# Patient Record
Sex: Male | Born: 1995 | Race: Asian
Health system: Southern US, Academic
[De-identification: ages and names within clinical notes are randomized; demographics above are authoritative.]

## PROBLEM LIST (undated history)

## (undated) ENCOUNTER — Encounter

## (undated) DIAGNOSIS — T7840XA Allergy, unspecified, initial encounter: Secondary | ICD-10-CM

## (undated) DIAGNOSIS — J359 Chronic disease of tonsils and adenoids, unspecified: Secondary | ICD-10-CM

## (undated) DIAGNOSIS — G43909 Migraine, unspecified, not intractable, without status migrainosus: Secondary | ICD-10-CM

## (undated) DIAGNOSIS — R55 Syncope and collapse: Secondary | ICD-10-CM

## (undated) HISTORY — PX: TONSILLECTOMY: SHX5217

## (undated) HISTORY — PX: DENTAL SURGERY: SHX609

## (undated) HISTORY — DX: Syncope and collapse: R55

## (undated) HISTORY — DX: Chronic disease of tonsils and adenoids, unspecified: J35.9

## (undated) HISTORY — DX: Migraine, unspecified, not intractable, without status migrainosus: G43.909

## (undated) HISTORY — DX: Allergy, unspecified, initial encounter: T78.40XA

---
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
## 2013-06-23 ENCOUNTER — Encounter: Payer: Self-pay | Admitting: Family Medicine

## 2013-06-23 ENCOUNTER — Ambulatory Visit (INDEPENDENT_AMBULATORY_CARE_PROVIDER_SITE_OTHER): Payer: 59 | Admitting: Family Medicine

## 2013-06-23 VITALS — BP 128/78 | HR 91 | Temp 97.9°F | Wt 230.0 lb

## 2013-06-23 DIAGNOSIS — J019 Acute sinusitis, unspecified: Secondary | ICD-10-CM

## 2013-06-23 MED ORDER — AZITHROMYCIN 250 MG PO TABS: ORAL_TABLET | ORAL | Status: AC

## 2013-06-23 NOTE — Progress Notes (Signed)
Pre visit review using our clinic review tool, if applicable. No additional management support is needed unless otherwise documented below in the visit note. 

## 2013-06-23 NOTE — Patient Instructions (Signed)

## 2013-06-23 NOTE — Progress Notes (Signed)
   Subjective:    Patient ID: James Mckenzie, male    DOB: [birth year], 17 y.o.   MRN: [id number]  HPI Acute visit Patient seen with over one week history of progressive productive cough and sinus congestion. Occasional blood-tinged nasal mucus. Has some chills but has not taken temperature. Intermittent mild sore throat. Intermittent headaches. Rarely smokes cigarettes. No dyspnea. No wheezing. No sick contacts. Denies any nausea or vomiting.  Past Medical History  Diagnosis Date  . Fainting spell 10/2010  . Tonsil and adenoid disease, chronic   . Allergy   . Migraine    Past Surgical History  Procedure Laterality Date  . Tonsillectomy      reports that he has never smoked. He does not have any smokeless tobacco history on file. His alcohol and drug histories are not on file. family history includes Arthritis in his maternal grandmother; Cancer in his maternal grandmother and paternal grandfather; Diabetes in his maternal grandfather; Hyperlipidemia in his maternal grandfather, maternal grandmother, paternal grandfather, and paternal grandmother; Hypertension in his father, maternal grandfather, maternal grandmother, mother, paternal grandfather, and paternal grandmother. No Known Allergies    Review of Systems  Constitutional: Positive for chills and fatigue.  HENT: Positive for congestion and sore throat.   Respiratory: Positive for cough. Negative for shortness of breath and wheezing.   Neurological: Positive for headaches.       Objective:   Physical Exam  Constitutional: He appears well-developed and well-nourished.  HENT:  Right Ear: External ear normal.  Left Ear: External ear normal.  Nose: Nose normal.  Mouth/Throat: Oropharynx is clear and moist.  Neck: Neck supple.  Cardiovascular: Normal rate.   Pulmonary/Chest: Effort normal and breath sounds normal. No respiratory distress. He has no wheezes. He has no rales.  Lymphadenopathy:    He has no cervical adenopathy.           Assessment & Plan:  Probable acute sinusitis. Start Zithromax for 5 days. Consider over-the-counter Mucinex. Plenty of fluids. Followup when necessary

## 2013-06-26 ENCOUNTER — Telehealth: Payer: Self-pay | Admitting: Family Medicine

## 2013-06-26 NOTE — Telephone Encounter (Signed)
Pt's mom states that pt still has cough and feels that the medication prescribed to him on 12/2 is not working. Mom would like something different to be called in. Please advise.

## 2013-06-26 NOTE — Telephone Encounter (Signed)
Called and left message on pt cell phone that Dr. Caryl Never is out of the office and will return on 06-29-13.

## 2014-09-16 ENCOUNTER — Encounter: Payer: Self-pay | Admitting: Family Medicine

## 2014-09-16 ENCOUNTER — Ambulatory Visit (INDEPENDENT_AMBULATORY_CARE_PROVIDER_SITE_OTHER): Payer: 59 | Admitting: Family Medicine

## 2014-09-16 DIAGNOSIS — R21 Rash and other nonspecific skin eruption: Secondary | ICD-10-CM

## 2014-09-16 NOTE — Progress Notes (Signed)
   Subjective:    Patient ID: James Mckenzie, male    DOB: 05-15-1996, 19 y.o.   MRN: [id number]  HPI   Patient seen with pruritic scaly rash palms of both hands for about 2 weeks. No chemical exposures. He describes several small "bumps "they came up that he popped and then left some scaling. He's not had any similar rash on his feet. No prior history of similar rash. He tried over-the-counter hydrocortisone cream without much improvement. No interdigital involvement  Past Medical History  Diagnosis Date  . Fainting spell 10/2010  . Tonsil and adenoid disease, chronic   . Allergy   . Migraine    Past Surgical History  Procedure Laterality Date  . Tonsillectomy      reports that he has never smoked. He does not have any smokeless tobacco history on file. His alcohol and drug histories are not on file. family history includes Arthritis in his maternal grandmother; Cancer in his maternal grandmother and paternal grandfather; Diabetes in his maternal grandfather; Hyperlipidemia in his maternal grandfather, maternal grandmother, paternal grandfather, and paternal grandmother; Hypertension in his father, maternal grandfather, maternal grandmother, mother, paternal grandfather, and paternal grandmother. No Known Allergies    Review of Systems  Constitutional: Negative for fever and chills.  Skin: Positive for rash.       Objective:   Physical Exam  Constitutional: He appears well-developed and well-nourished.  Cardiovascular: Normal rate and regular rhythm.   Skin: Rash noted.  Patient has some nonspecific scaling of both hands. There are no pustules. No visible vesicles. No interdigital involvement. Left hand greater than right.          Assessment & Plan:  Skin rash involving both hands. Differential is Tinea manus versus dyshidrotic type eczema. This does not appear typical of pustular psoriasis. Obtain KOH to help guide therapy.  If negative, high potency topical steroid.

## 2014-09-16 NOTE — Patient Instructions (Signed)
We will call you after KOH back to help guide topical therapy

## 2014-09-16 NOTE — Progress Notes (Signed)
Pre visit review using our clinic review tool, if applicable. No additional management support is needed unless otherwise documented below in the visit note. 

## 2014-09-17 ENCOUNTER — Other Ambulatory Visit: Payer: Self-pay

## 2014-09-17 LAB — KOH PREP: RESULT - KOH: NONE SEEN

## 2014-09-17 MED ORDER — DESOXIMETASONE 0.25 % EX CREA: 1.0000 "application " | TOPICAL_CREAM | Freq: Two times a day (BID) | CUTANEOUS | Status: DC

## 2015-02-01 ENCOUNTER — Encounter: Payer: Self-pay | Admitting: Internal Medicine

## 2015-02-01 ENCOUNTER — Ambulatory Visit (INDEPENDENT_AMBULATORY_CARE_PROVIDER_SITE_OTHER): Payer: 59 | Admitting: Internal Medicine

## 2015-02-01 VITALS — BP 130/90 | HR 80 | Temp 98.2°F | Resp 20 | Ht 70.25 in | Wt 240.0 lb

## 2015-02-01 DIAGNOSIS — J069 Acute upper respiratory infection, unspecified: Secondary | ICD-10-CM | POA: Diagnosis not present

## 2015-02-01 DIAGNOSIS — G43109 Migraine with aura, not intractable, without status migrainosus: Secondary | ICD-10-CM

## 2015-02-01 DIAGNOSIS — B9789 Other viral agents as the cause of diseases classified elsewhere: Secondary | ICD-10-CM

## 2015-02-01 MED ORDER — PREDNISONE 20 MG PO TABS: 20.0000 mg | ORAL_TABLET | Freq: Two times a day (BID) | ORAL | Status: DC

## 2015-02-01 NOTE — Progress Notes (Signed)
Pre visit review using our clinic review tool, if applicable. No additional management support is needed unless otherwise documented below in the visit note. 

## 2015-02-01 NOTE — Progress Notes (Signed)
Subjective:    Patient ID: James Mckenzie, male    DOB: May 23, 1996, 19 y.o.   MRN: [id number]  HPI  19 year old patient who presents with a chief complaint of chronic cough.  He states that he had the "flu" 6 weeks ago and has had a persistent cough.  He states that he is expectorating yellow sputum.  He also describes some nasal congestion.  He has taken no OTC medications.  No fever.  Denies any wheezing or shortness of breath. He also states that for the past month he has had worsening and more frequent migraine headaches.  He does have a history of allergies and in the past has been treated with immunotherapy  Past Medical History  Diagnosis Date  . Fainting spell 10/2010  . Tonsil and adenoid disease, chronic   . Allergy   . Migraine     History   Social History  . Marital Status: Single    Spouse Name: N/A  . Number of Children: N/A  . Years of Education: N/A   Occupational History  . Not on file.   Social History Main Topics  . Smoking status: Never Smoker   . Smokeless tobacco: Not on file  . Alcohol Use: Not on file  . Drug Use: Not on file  . Sexual Activity: Not on file   Other Topics Concern  . Not on file   Social History Narrative    Past Surgical History  Procedure Laterality Date  . Tonsillectomy      Family History  Problem Relation Age of Onset  . Hypertension Mother   . Hypertension Father   . Arthritis Maternal Grandmother   . Cancer Maternal Grandmother     breast  . Hyperlipidemia Maternal Grandmother   . Hypertension Maternal Grandmother   . Hyperlipidemia Maternal Grandfather   . Hypertension Maternal Grandfather   . Diabetes Maternal Grandfather   . Hyperlipidemia Paternal Grandmother   . Hypertension Paternal Grandmother   . Cancer Paternal Grandfather     prostate  . Hyperlipidemia Paternal Grandfather   . Hypertension Paternal Grandfather     No Known Allergies  Current Outpatient Prescriptions on File Prior to Visit    Medication Sig Dispense Refill  . desoximetasone (TOPICORT) 0.25 % cream Apply 1 application topically 2 (two) times daily. 15 g 2   No current facility-administered medications on file prior to visit.    BP 130/90 mmHg  Pulse 80  Temp(Src) 98.2 F (36.8 C) (Oral)  Resp 20  Ht 5' 10.25" (1.784 m)  Wt 240 lb (108.863 kg)  BMI 34.21 kg/m2  SpO2 98%     Review of Systems  Constitutional: Positive for activity change and fatigue. Negative for fever, chills and appetite change.  HENT: Negative for congestion, dental problem, ear pain, hearing loss, sore throat, tinnitus, trouble swallowing and voice change.   Eyes: Negative for pain, discharge and visual disturbance.  Respiratory: Positive for cough. Negative for chest tightness, wheezing and stridor.   Cardiovascular: Negative for chest pain, palpitations and leg swelling.  Gastrointestinal: Negative for nausea, vomiting, abdominal pain, diarrhea, constipation, blood in stool and abdominal distention.  Genitourinary: Negative for urgency, hematuria, flank pain, discharge, difficulty urinating and genital sores.  Musculoskeletal: Negative for myalgias, back pain, joint swelling, arthralgias, gait problem and neck stiffness.  Skin: Negative for rash.  Neurological: Positive for headaches. Negative for dizziness, syncope, speech difficulty, weakness and numbness.  Hematological: Negative for adenopathy. Does not bruise/bleed easily.  Psychiatric/Behavioral: Negative for  behavioral problems and dysphoric mood. The patient is not nervous/anxious.        Objective:   Physical Exam  Constitutional: He is oriented to person, place, and time. He appears well-developed.  Afebrile No distress  HENT:  Head: Normocephalic.  Right Ear: External ear normal.  Left Ear: External ear normal.  Eyes: Conjunctivae and EOM are normal.  Neck: Normal range of motion.  Cardiovascular: Normal rate and normal heart sounds.   Pulmonary/Chest: Breath  sounds normal. No respiratory distress. He has no wheezes. He has no rales.  Abdominal: Bowel sounds are normal.  Musculoskeletal: Normal range of motion. He exhibits no edema or tenderness.  Neurological: He is alert and oriented to person, place, and time.  Psychiatric: He has a normal mood and affect. His behavior is normal.          Assessment & Plan:   Status post URI with protracted cough History of allergic rhinitis  Will treat symptomatically.  Will also treat with oral prednisone for 6 days Will call if unimproved

## 2015-02-01 NOTE — Patient Instructions (Signed)
Acute bronchitis symptoms  are generally not helped by antibiotics.  Take over-the-counter expectorants and cough medications such as  Mucinex DM.  Call if there is no improvement in 5 to 7 days or if  you develop worsening cough, fever, or new symptoms, such as shortness of breath or chest pain.  HOME CARE INSTRUCTIONS  Get plenty of rest.  Drink enough fluids to keep your urine clear or pale yellow (unless you have a medical condition that requires fluid restriction). Increasing fluids may help thin your respiratory secretions (sputum) and reduce chest congestion, and it will prevent dehydration.  Take medicines only as directed by your health care provider.

## 2015-07-22 ENCOUNTER — Encounter: Payer: Self-pay | Admitting: Family Medicine

## 2015-07-22 ENCOUNTER — Ambulatory Visit (INDEPENDENT_AMBULATORY_CARE_PROVIDER_SITE_OTHER): Payer: 59 | Admitting: Family Medicine

## 2015-07-22 VITALS — BP 100/80 | HR 84 | Temp 98.0°F | Resp 14 | Ht 70.25 in | Wt 249.8 lb

## 2015-07-22 DIAGNOSIS — H04301 Unspecified dacryocystitis of right lacrimal passage: Secondary | ICD-10-CM | POA: Diagnosis not present

## 2015-07-22 DIAGNOSIS — L600 Ingrowing nail: Secondary | ICD-10-CM

## 2015-07-22 MED ORDER — CEPHALEXIN 500 MG PO CAPS: 500.0000 mg | ORAL_CAPSULE | Freq: Three times a day (TID) | ORAL | Status: DC

## 2015-07-22 NOTE — Progress Notes (Signed)
   Subjective:    Patient ID: James Mckenzie, male    DOB: Sep 07, 1995, 19 y.o.   MRN: [id number]  HPI Patient seen for the following issues two new acute issues  Right upper eyelid swelling for the past 2 days. No injury. No rash. Slightly tender. No secretions. No visual changes. He thought that he had a "stye". Has not tried any warm compresses. No alleviating or exacerbating factors. No history of similar problem in the past.  Left great toe pain. Recently had little bit of drainage. Tender along the medial border. Mild erythema. No fevers or chills. No injury. No history of previous ingrown toenail.  Past Medical History  Diagnosis Date  . Fainting spell 10/2010  . Tonsil and adenoid disease, chronic   . Allergy   . Migraine    Past Surgical History  Procedure Laterality Date  . Tonsillectomy      reports that he has never smoked. He does not have any smokeless tobacco history on file. His alcohol and drug histories are not on file. family history includes Arthritis in his maternal grandmother; Cancer in his maternal grandmother and paternal grandfather; Diabetes in his maternal grandfather; Hyperlipidemia in his maternal grandfather, maternal grandmother, paternal grandfather, and paternal grandmother; Hypertension in his father, maternal grandfather, maternal grandmother, mother, paternal grandfather, and paternal grandmother. No Known Allergies    Review of Systems  Constitutional: Negative for fever and chills.  HENT: Negative for congestion, rhinorrhea, sinus pressure and sore throat.   Eyes: Negative for redness, itching and visual disturbance.  Respiratory: Negative for cough.   Skin: Negative for rash.  Neurological: Negative for weakness and headaches.  Hematological: Negative for adenopathy.       Objective:   Physical Exam  Constitutional: He appears well-developed and well-nourished. No distress.  Eyes:  Right upper eyelid is slightly swollen diffusely  compared to the left. Minimally tender. He has some mild crusting along the margin of the right upper lid. No conjunctivitis changes. Pupils equal round reactive to light. No periocular rash  Cardiovascular: Normal rate and regular rhythm.   Pulmonary/Chest: Effort normal and breath sounds normal. No respiratory distress. He has no wheezes. He has no rales.  Skin:  Left great toe ingrown medial border with minimal granulation tissue. He has minimal purulent drainage in the corner but no fluctuance. No evidence or paronychia. Minimal tenderness.          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 May have some blepharitis as well with changes above. We recommended Johnson's baby shampoo and soft bristle toothbrush to clean margin of upper lid couple times daily. Warm compresses several times daily. Touch base of not improving over the next week #2 ingrown left great toenail. Mild cellulitis changes. Keflex 500 mg 3 times a day for 10 days and warm saltwater soaks. Schedule for nail excision in 1-2 weeks if not improving

## 2015-07-22 NOTE — Patient Instructions (Signed)
Warm salt water soaks for left foot two to three times daily Warm compresses to left eye 3-4 times daily Soft bristle toothbrush and gently clean left eyelid with Johnson's Baby Shampoo.

## 2015-07-22 NOTE — Progress Notes (Signed)
Pre visit review using our clinic review tool, if applicable. No additional management support is needed unless otherwise documented below in the visit note. 

## 2015-11-10 ENCOUNTER — Encounter: Payer: Self-pay | Admitting: Family Medicine

## 2015-11-10 ENCOUNTER — Ambulatory Visit (INDEPENDENT_AMBULATORY_CARE_PROVIDER_SITE_OTHER): Payer: 59 | Admitting: Family Medicine

## 2015-11-10 VITALS — BP 110/80 | HR 97 | Temp 98.3°F | Ht 70.25 in | Wt 252.0 lb

## 2015-11-10 DIAGNOSIS — Z23 Encounter for immunization: Secondary | ICD-10-CM

## 2015-11-10 DIAGNOSIS — H0016 Chalazion left eye, unspecified eyelid: Secondary | ICD-10-CM

## 2015-11-10 DIAGNOSIS — H0013 Chalazion right eye, unspecified eyelid: Secondary | ICD-10-CM

## 2015-11-10 DIAGNOSIS — L600 Ingrowing nail: Secondary | ICD-10-CM | POA: Diagnosis not present

## 2015-11-10 NOTE — Patient Instructions (Signed)
Chalazion A chalazion is a swelling or lump on the eyelid. It can affect the upper or lower eyelid. CAUSES This condition may be caused by:  Long-lasting (chronic) inflammation of the eyelid glands.  A blocked oil gland in the eyelid. SYMPTOMS Symptoms of this condition include:  A swelling on the eyelid. The swelling may spread to areas around the eye.  A hard lump on the eyelid. This lump may make it hard to see out of the eye. DIAGNOSIS This condition is diagnosed with an examination of the eye. TREATMENT This condition is treated by applying a warm compress to the eyelid. If the condition does not improve after two days, it may be treated with:  Surgery.  Medicine that is injected into the chalazion by a health care provider.  Medicine that is applied to the eye. HOME CARE INSTRUCTIONS  Do not touch the chalazion.  Do not try to remove the pus, such as by squeezing the chalazion or sticking it with a pin or needle.  Do not rub your eyes.  Wash your hands often. Dry your hands with a clean towel.  Keep your face, scalp, and eyebrows clean.  Avoid wearing eye makeup.  Apply a warm, moist compress to the eyelid 4-6 times a day for 10-15 minutes at a time. This will help to open any blocked glands and help to reduce redness and swelling.  Apply over-the-counter and prescription medicines only as told by your health care provider.  If the chalazion does not break open (rupture) on its own in a month, return to your health care provider.  Keep all follow-up appointments as told by your health care provider. This is important. SEEK MEDICAL CARE IF:  Your eyelid has not improved in 4 weeks.  Your eyelid is getting worse.  You have a fever.  The chalazion does not rupture on its own with home treatment in a month. SEEK IMMEDIATE MEDICAL CARE IF:  You have pain in your eye.  Your vision changes.  The chalazion becomes painful or red  The chalazion gets  bigger.   This information is not intended to replace advice given to you by your health care provider. Make sure you discuss any questions you have with your health care provider.   Document Released: 07/06/2000 Document Revised: 03/30/2015 Document Reviewed: 11/01/2014 Elsevier Interactive Patient Education 2016 [employer]  Elevate foot frequently for next several hours Keep dry tonight then clean with soap and water

## 2015-11-10 NOTE — Progress Notes (Signed)
Pre visit review using our clinic review tool, if applicable. No additional management support is needed unless otherwise documented below in the visit note. 

## 2015-11-10 NOTE — Progress Notes (Signed)
   Subjective:    Patient ID: James MilchJoshua Marcussen, male    DOB: 04-21-1996, 20 y.o.   MRN: [id number]  HPI Patient seen for the following to new acute issues:  He's noted small nonpainful nodular swelling both of the upper medial aspect of the left and right lids. No erythema. No drainage. No visual difficulties. He first noticed several weeks ago and has used warm compresses several times daily without improvement  Patient also complains of ingrown left great toenail. Marland Kitchen. He had some redness and slight drainage over the past several weeks not improved with salt water soaks. No fevers or chills.   Increasing pain with ambulation  prior history of ingrown toenail which he states is on the right- though our records indicate left  Past Medical History  Diagnosis Date  . Fainting spell 10/2010  . Tonsil and adenoid disease, chronic   . Allergy   . Migraine    Past Surgical History  Procedure Laterality Date  . Tonsillectomy      reports that he has never smoked. He does not have any smokeless tobacco history on file. His alcohol and drug histories are not on file. family history includes Arthritis in his maternal grandmother; Cancer in his maternal grandmother and paternal grandfather; Diabetes in his maternal grandfather; Hyperlipidemia in his maternal grandfather, maternal grandmother, paternal grandfather, and paternal grandmother; Hypertension in his father, maternal grandfather, maternal grandmother, mother, paternal grandfather, and paternal grandmother. No Known Allergies    Review of Systems  Constitutional: Negative for fever and chills.  Eyes: Negative for photophobia, pain, discharge, redness, itching and visual disturbance.  Musculoskeletal: Negative for gait problem.  Neurological: Negative for headaches.  Hematological: Negative for adenopathy. Does not bruise/bleed easily.       Objective:   Physical Exam  Constitutional: He appears well-developed and well-nourished.  Eyes:   Most prominent of the left upper lid medially and also right upper lid-no signs of inflammation such as erythema or tenderness. No evidence for stye.  Cardiovascular: Normal rate and regular rhythm.   Pulmonary/Chest: Effort normal and breath sounds normal. No respiratory distress. He has no wheezes. He has no rales.  Skin:  Left great toe reveals ingrown medial border. He has some very minimal granulation tissue. Tender to palpation. Minimal erythema.          Assessment & Plan:   #1 chalazions-  Right and left upper lids. No signs of infection. Not improved with conservative treatment with warm compresses. Set up ophthalmology referral per patient request   #2 ingrown left great toenail. Patient has tried conservative measures with saltwater soaks for several weeks without improvement. We discussed risk and benefits of digital block and partial nail excision and patient consented. Toe prepped with Betadine. Using 2% plain Xylocaine digital block left great toe. Using straight hemostats and surgical scissors removed approximately one third of the toenail (medial border). Minimal bleeding. Topical antibiotic and dressing applied. Wound care  Instruction given

## 2015-11-17 LAB — HM DIABETES EYE EXAM

## 2015-11-18 ENCOUNTER — Ambulatory Visit (INDEPENDENT_AMBULATORY_CARE_PROVIDER_SITE_OTHER): Payer: 59 | Admitting: Family Medicine

## 2015-11-18 ENCOUNTER — Other Ambulatory Visit: Payer: Self-pay | Admitting: Family Medicine

## 2015-11-18 VITALS — BP 110/70 | HR 86 | Temp 97.6°F | Ht 70.25 in | Wt 250.0 lb

## 2015-11-18 DIAGNOSIS — L600 Ingrowing nail: Secondary | ICD-10-CM

## 2015-11-18 NOTE — Patient Instructions (Addendum)
Ingrown Toenail An ingrown toenail occurs when the corner or sides of your toenail grow into the surrounding skin. The big toe is most commonly affected, but it can happen to any of your toes. If your ingrown toenail is not treated, you will be at risk for infection. CAUSES This condition may be caused by:  Wearing shoes that are too small or tight.  Injury or trauma, such as stubbing your toe or having your toe stepped on.  Improper cutting or care of your toenails.  Being born with (congenital) nail or foot abnormalities, such as having a nail that is too big for your toe. RISK FACTORS Risk factors for an ingrown toenail include:  Age. Your nails tend to thicken as you get older, so ingrown nails are more common in older people.  Diabetes.  Cutting your toenails incorrectly.  Blood circulation problems. SYMPTOMS Symptoms may include:  Pain, soreness, or tenderness.  Redness.  Swelling.  Hardening of the skin surrounding the toe. Your ingrown toenail may be infected if there is fluid, pus, or drainage. DIAGNOSIS  An ingrown toenail may be diagnosed by medical history and physical exam. If your toenail is infected, your health care provider may test a sample of the drainage. TREATMENT Treatment depends on the severity of your ingrown toenail. Some ingrown toenails may be treated at home. More severe or infected ingrown toenails may require surgery to remove all or part of the nail. Infected ingrown toenails may also be treated with antibiotic medicines. HOME CARE INSTRUCTIONS  If you were prescribed an antibiotic medicine, finish all of it even if you start to feel better.  Soak your foot in warm soapy water for 20 minutes, 3 times per day or as directed by your health care provider.  Carefully lift the edge of the nail away from the sore skin by wedging a small piece of cotton under the corner of the nail. This may help with the pain. Be careful not to cause more injury  to the area.  Wear shoes that fit well. If your ingrown toenail is causing you pain, try wearing sandals, if possible.  Trim your toenails regularly and carefully. Do not cut them in a curved shape. Cut your toenails straight across. This prevents injury to the skin at the corners of the toenail.  Keep your feet clean and dry.  If you are having trouble walking and are given crutches by your health care provider, use them as directed.  Do not pick at your toenail or try to remove it yourself.  Take medicines only as directed by your health care provider.  Keep all follow-up visits as directed by your health care provider. This is important. SEEK MEDICAL CARE IF:  Your symptoms do not improve with treatment. SEEK IMMEDIATE MEDICAL CARE IF:  You have red streaks that start at your foot and go up your leg.  You have a fever.  You have increased redness, swelling, or pain.  You have fluid, blood, or pus coming from your toenail.   This information is not intended to replace advice given to you by your health care provider. Make sure you discuss any questions you have with your health care provider.   Document Released: 07/06/2000 Document Revised: 11/23/2014 Document Reviewed: 06/02/2014 Elsevier Interactive Patient Education 2016 [employer]   Keep foot elevated tonight Keep dry until tomorrow then clean daily with soap and water Apply topical antibiotic to toe once daily for 5-6 days. Follow up as needed.

## 2015-11-18 NOTE — Progress Notes (Signed)
   Subjective:    Patient ID: James Mckenzie, male    DOB: 21-Feb-1996, 20 y.o.   MRN: [id number]  HPI   Recent left ingrown toenail removed and patient returns today requesting right toenail be removed along the medial border. He's had some soreness which is somewhat intermittent. Has had occasional drainage and erythema. Ambulating without much difficulty.  Had surgery yesterday for chalazion removal of right and left upper eye lids.  Past Medical History  Diagnosis Date  . Fainting spell 10/2010  . Tonsil and adenoid disease, chronic   . Allergy   . Migraine    Past Surgical History  Procedure Laterality Date  . Tonsillectomy      reports that he has never smoked. He does not have any smokeless tobacco history on file. His alcohol and drug histories are not on file. family history includes Arthritis in his maternal grandmother; Cancer in his maternal grandmother and paternal grandfather; Diabetes in his maternal grandfather; Hyperlipidemia in his maternal grandfather, maternal grandmother, paternal grandfather, and paternal grandmother; Hypertension in his father, maternal grandfather, maternal grandmother, mother, paternal grandfather, and paternal grandmother. No Known Allergies    Review of Systems  Constitutional: Negative for fever and chills.  Eyes: Negative for visual disturbance.       Objective:   Physical Exam  Constitutional: He appears well-developed and well-nourished.  Cardiovascular: Normal rate and regular rhythm.   Pulmonary/Chest: Effort normal and breath sounds normal. No respiratory distress. He has no wheezes. He has no rales.  Skin:  Right great toe-minimally tender on the medial border. No granulation tissue. No signs of secondary infection.          Assessment & Plan:   Ingrown right great toenail. Discussed risk and benefits of partial excision. Patient consented. Digital block with 2% plain Xylocaine. Toe prepped with Betadine.  After achieving  full digital block, used straight hemostats and freed up medial border of nail and using surgical scissors cut to base of nail 1/3 involved medial border.  This was removed without difficulty.  Minimal bleeding.

## 2015-11-18 NOTE — Progress Notes (Signed)
Pre visit review using our clinic review tool, if applicable. No additional management support is needed unless otherwise documented below in the visit note. 

## 2015-11-29 ENCOUNTER — Emergency Department (HOSPITAL_COMMUNITY)
Admission: EM | Admit: 2015-11-29 | Discharge: 2015-11-29 | Disposition: A | Discharge status: Home / Self Care | Payer: 59 | Attending: Emergency Medicine | Admitting: Emergency Medicine

## 2015-11-29 ENCOUNTER — Emergency Department (HOSPITAL_COMMUNITY): Payer: 59

## 2015-11-29 ENCOUNTER — Encounter (HOSPITAL_COMMUNITY): Payer: Self-pay | Admitting: Emergency Medicine

## 2015-11-29 ENCOUNTER — Encounter: Payer: Self-pay | Admitting: Adult Health

## 2015-11-29 ENCOUNTER — Ambulatory Visit (INDEPENDENT_AMBULATORY_CARE_PROVIDER_SITE_OTHER): Payer: 59 | Admitting: Adult Health

## 2015-11-29 VITALS — BP 120/70 | Temp 98.4°F | Wt 249.0 lb

## 2015-11-29 DIAGNOSIS — Z792 Long term (current) use of antibiotics: Secondary | ICD-10-CM | POA: Insufficient documentation

## 2015-11-29 DIAGNOSIS — Z791 Long term (current) use of non-steroidal anti-inflammatories (NSAID): Secondary | ICD-10-CM | POA: Insufficient documentation

## 2015-11-29 DIAGNOSIS — J0100 Acute maxillary sinusitis, unspecified: Secondary | ICD-10-CM | POA: Diagnosis not present

## 2015-11-29 DIAGNOSIS — R1084 Generalized abdominal pain: Secondary | ICD-10-CM | POA: Diagnosis not present

## 2015-11-29 DIAGNOSIS — R1031 Right lower quadrant pain: Secondary | ICD-10-CM | POA: Diagnosis present

## 2015-11-29 DIAGNOSIS — R1030 Lower abdominal pain, unspecified: Secondary | ICD-10-CM

## 2015-11-29 DIAGNOSIS — Z79899 Other long term (current) drug therapy: Secondary | ICD-10-CM | POA: Insufficient documentation

## 2015-11-29 DIAGNOSIS — J0140 Acute pansinusitis, unspecified: Secondary | ICD-10-CM

## 2015-11-29 DIAGNOSIS — R112 Nausea with vomiting, unspecified: Secondary | ICD-10-CM

## 2015-11-29 LAB — COMPREHENSIVE METABOLIC PANEL
ALBUMIN: 4.6 g/dL (ref 3.5–5.0)
ALK PHOS: 71 U/L (ref 38–126)
ALT: 30 U/L (ref 17–63)
AST: 17 U/L (ref 15–41)
Anion gap: 9 (ref 5–15)
BUN: 10 mg/dL (ref 6–20)
CHLORIDE: 99 mmol/L — AB (ref 101–111)
CO2: 27 mmol/L (ref 22–32)
Calcium: 9.4 mg/dL (ref 8.9–10.3)
Creatinine, Ser: 1.12 mg/dL (ref 0.61–1.24)
GFR calc Af Amer: >60 mL/min (ref >60)
GFR calc non Af Amer: >60 mL/min (ref >60)
Glucose, Bld: 105 mg/dL — ABNORMAL HIGH (ref 65–99)
Potassium: 3.6 mmol/L (ref 3.5–5.1)
SODIUM: 135 mmol/L (ref 135–145)
Total Bilirubin: 1 mg/dL (ref 0.3–1.2)
Total Protein: 7.8 g/dL (ref 6.5–8.1)

## 2015-11-29 LAB — CBC
HEMATOCRIT: 44.5 % (ref 39.0–52.0)
HEMOGLOBIN: 14.8 g/dL (ref 13.0–17.0)
MCH: 28.3 pg (ref 26.0–34.0)
MCHC: 33.3 g/dL (ref 30.0–36.0)
MCV: 85.1 fL (ref 78.0–100.0)
Platelets: 191 10*3/uL (ref 150–400)
RBC: 5.23 MIL/uL (ref 4.22–5.81)
RDW: 14.2 % (ref 11.5–15.5)
WBC: 10.6 10*3/uL — ABNORMAL HIGH (ref 4.0–10.5)

## 2015-11-29 LAB — URINALYSIS, ROUTINE W REFLEX MICROSCOPIC
Bilirubin Urine: NEGATIVE
GLUCOSE, UA: NEGATIVE mg/dL
HGB URINE DIPSTICK: NEGATIVE
Ketones, ur: NEGATIVE mg/dL
Leukocytes, UA: NEGATIVE
Nitrite: NEGATIVE
Protein, ur: NEGATIVE mg/dL
SPECIFIC GRAVITY, URINE: 1.031 — AB (ref 1.005–1.030)
pH: 6.5 (ref 5.0–8.0)

## 2015-11-29 LAB — LIPASE, BLOOD: Lipase: 17 U/L (ref 11–51)

## 2015-11-29 MED ORDER — HYDROCODONE-HOMATROPINE 5-1.5 MG/5ML PO SYRP: 5.0000 mL | ORAL_SOLUTION | Freq: Four times a day (QID) | ORAL | Status: DC | PRN

## 2015-11-29 MED ORDER — IOPAMIDOL (ISOVUE-300) INJECTION 61%
100.0000 mL | Freq: Once | INTRAVENOUS | Status: AC | PRN
  Administered 2015-11-29: 100 mL via INTRAVENOUS

## 2015-11-29 MED ORDER — AMOXICILLIN-POT CLAVULANATE 875-125 MG PO TABS: 1.0000 | ORAL_TABLET | Freq: Two times a day (BID) | ORAL | Status: DC

## 2015-11-29 MED ORDER — ONDANSETRON HCL 4 MG PO TABS: 4.0000 mg | ORAL_TABLET | Freq: Four times a day (QID) | ORAL | Status: DC

## 2015-11-29 MED ORDER — DIATRIZOATE MEGLUMINE & SODIUM 66-10 % PO SOLN
30.0000 mL | Freq: Once | ORAL | Status: AC
  Administered 2015-11-29: 30 mL via ORAL

## 2015-11-29 MED ORDER — ONDANSETRON HCL 4 MG/2ML IJ SOLN
4.0000 mg | Freq: Once | INTRAMUSCULAR | Status: AC
  Administered 2015-11-29: 4 mg via INTRAVENOUS
  Filled 2015-11-29: qty 2

## 2015-11-29 MED ORDER — SODIUM CHLORIDE 0.9 % IV BOLUS (SEPSIS)
1000.0000 mL | Freq: Once | INTRAVENOUS | Status: AC
  Administered 2015-11-29: 1000 mL via INTRAVENOUS

## 2015-11-29 MED ORDER — FENTANYL CITRATE (PF) 100 MCG/2ML IJ SOLN
100.0000 ug | Freq: Once | INTRAMUSCULAR | Status: AC
  Administered 2015-11-29: 100 ug via INTRAVENOUS
  Filled 2015-11-29: qty 2

## 2015-11-29 NOTE — ED Provider Notes (Signed)
Patient accepted in sign out from Dr. Radford PaxBeaton pending CT. CT negative for acute process.  As was plan CT was negative and patient was discharged home in stable condition.  Leta BaptistEmily Roe Nguyen, MD 11/29/15 [phone]

## 2015-11-29 NOTE — Patient Instructions (Signed)
It was great meeting you today!  I am going to send you to the ER so that you can be further evaluated.

## 2015-11-29 NOTE — Discharge Instructions (Signed)
Sinusitis, Adult °Sinusitis is redness, soreness, and inflammation of the paranasal sinuses. Paranasal sinuses are air pockets within the bones of your face. They are located beneath your eyes, in the middle of your forehead, and above your eyes. In healthy paranasal sinuses, mucus is able to drain out, and air is able to circulate through them by way of your nose. However, when your paranasal sinuses are inflamed, mucus and air can become trapped. This can allow bacteria and other germs to grow and cause infection. °Sinusitis can develop quickly and last only a short time (acute) or continue over a long period (chronic). Sinusitis that lasts for more than 12 weeks is considered chronic. °CAUSES °Causes of sinusitis include: °· Allergies. °· Structural abnormalities, such as displacement of the cartilage that separates your nostrils (deviated septum), which can decrease the air flow through your nose and sinuses and affect sinus drainage. °· Functional abnormalities, such as when the small hairs (cilia) that line your sinuses and help remove mucus do not work properly or are not present. °SIGNS AND SYMPTOMS °Symptoms of acute and chronic sinusitis are the same. The primary symptoms are pain and pressure around the affected sinuses. Other symptoms include: °· Upper toothache. °· Earache. °· Headache. °· Bad breath. °· Decreased sense of smell and taste. °· A cough, which worsens when you are lying flat. °· Fatigue. °· Fever. °· Thick drainage from your nose, which often is green and may contain pus (purulent). °· Swelling and warmth over the affected sinuses. °DIAGNOSIS °Your health care provider will perform a physical exam. During your exam, your health care provider may perform any of the following to help determine if you have acute sinusitis or chronic sinusitis: °· Look in your nose for signs of abnormal growths in your nostrils (nasal polyps). °· Tap over the affected sinus to check for signs of  infection. °· View the inside of your sinuses using an imaging device that has a light attached (endoscope). °If your health care provider suspects that you have chronic sinusitis, one or more of the following tests may be recommended: °· Allergy tests. °· Nasal culture. A sample of mucus is taken from your nose, sent to a lab, and screened for bacteria. °· Nasal cytology. A sample of mucus is taken from your nose and examined by your health care provider to determine if your sinusitis is related to an allergy. °TREATMENT °Most cases of acute sinusitis are related to a viral infection and will resolve on their own within 10 days. Sometimes, medicines are prescribed to help relieve symptoms of both acute and chronic sinusitis. These may include pain medicines, decongestants, nasal steroid sprays, or saline sprays. °However, for sinusitis related to a bacterial infection, your health care provider will prescribe antibiotic medicines. These are medicines that will help kill the bacteria causing the infection. °Rarely, sinusitis is caused by a fungal infection. In these cases, your health care provider will prescribe antifungal medicine. °For some cases of chronic sinusitis, surgery is needed. Generally, these are cases in which sinusitis recurs more than 3 times per year, despite other treatments. °HOME CARE INSTRUCTIONS °· Drink plenty of water. Water helps thin the mucus so your sinuses can drain more easily. °· Use a humidifier. °· Inhale steam 3-4 times a day (for example, sit in the bathroom with the shower running). °· Apply a warm, moist washcloth to your face 3-4 times a day, or as directed by your health care provider. °· Use saline nasal sprays to help   moisten and clean your sinuses.  Take medicines only as directed by your health care provider.  If you were prescribed either an antibiotic or antifungal medicine, finish it all even if you start to feel better. SEEK IMMEDIATE MEDICAL CARE IF:  You have  increasing pain or severe headaches.  You have nausea, vomiting, or drowsiness.  You have swelling around your face.  You have vision problems.  You have a stiff neck.  You have difficulty breathing.   This information is not intended to replace advice given to you by your health care provider. Make sure you discuss any questions you have with your health care provider.   Document Released: 07/09/2005 Document Revised: 07/30/2014 Document Reviewed: 07/24/2011 Elsevier Interactive Patient Education 2016 Elsevier Inc.  Nausea and Vomiting Nausea means you feel sick to your stomach. Throwing up (vomiting) is a reflex where stomach contents come out of your mouth. HOME CARE   Take medicine as told by your doctor.  Do not force yourself to eat. However, you do need to drink fluids.  If you feel like eating, eat a normal diet as told by your doctor.  Eat rice, wheat, potatoes, bread, lean meats, yogurt, fruits, and vegetables.  Avoid high-fat foods.  Drink enough fluids to keep your pee (urine) clear or pale yellow.  Ask your doctor how to replace body fluid losses (rehydrate). Signs of body fluid loss (dehydration) include:  Feeling very thirsty.  Dry lips and mouth.  Feeling dizzy.  Dark pee.  Peeing less than normal.  Feeling confused.  Fast breathing or heart rate. GET HELP RIGHT AWAY IF:   You have blood in your throw up.  You have black or bloody poop (stool).  You have a bad headache or stiff neck.  You feel confused.  You have bad belly (abdominal) pain.  You have chest pain or trouble breathing.  You do not pee at least once every 8 hours.  You have cold, clammy skin.  You keep throwing up after 24 to 48 hours.  You have a fever. MAKE SURE YOU:   Understand these instructions.  Will watch your condition.  Will get help right away if you are not doing well or get worse.   This information is not intended to replace advice given to you by  your health care provider. Make sure you discuss any questions you have with your health care provider.   Document Released: 12/26/2007 Document Revised: 10/01/2011 Document Reviewed: 12/08/2010 Elsevier Interactive Patient Education [employer] Elsevier Inc.

## 2015-11-29 NOTE — ED Notes (Signed)
Pt c/o gen abd pain with NVD x 2 days.  Pt has had a sinus infection and has been coughing up streaks of blood.  Pt sent here by doctor for CT scan to rule out appy.

## 2015-11-29 NOTE — Progress Notes (Signed)
   Subjective:    Patient ID: Daralene MilchJoshua Fujii, male    DOB: 06/17/1996, 20 y.o.   MRN: [id number]  HPI  20 year old male who presents to the office for 24 hours of sudden onset, fever, nausea, vomiting, abdominal pain, generalized muscle aches, subjective fever with chills, sinus pain and pressure and productive cough.   He has tried taking OTC cold medications but was unable to hold them down.   He continues to be nauseated.    Review of Systems  Constitutional: Positive for fever, chills, diaphoresis, activity change, appetite change and fatigue.  HENT: Positive for congestion, postnasal drip, rhinorrhea and sinus pressure. Negative for ear discharge, ear pain and sore throat.   Respiratory: Positive for cough.   Cardiovascular: Negative.   Gastrointestinal: Positive for nausea, vomiting and abdominal pain. Negative for abdominal distention.  Musculoskeletal: Positive for myalgias.  Skin: Negative.        Objective:   Physical Exam  Constitutional: He is oriented to person, place, and time. He appears well-developed and well-nourished. No distress.  HENT:  Head: Normocephalic and atraumatic.  Right Ear: Hearing, tympanic membrane, external ear and ear canal normal.  Left Ear: Hearing, tympanic membrane, external ear and ear canal normal.  Nose: Mucosal edema and rhinorrhea present. Right sinus exhibits maxillary sinus tenderness and frontal sinus tenderness. Left sinus exhibits maxillary sinus tenderness and frontal sinus tenderness.  Mouth/Throat: Oropharynx is clear and moist. No oropharyngeal exudate.  Eyes: Conjunctivae and EOM are normal. Pupils are equal, round, and reactive to light. Right eye exhibits no discharge. Left eye exhibits no discharge.  Neck: Normal range of motion. Neck supple.  Cardiovascular: Normal rate, regular rhythm, normal heart sounds and intact distal pulses.  Exam reveals no gallop and no friction rub.   No murmur heard. Pulmonary/Chest: Effort  normal and breath sounds normal. No respiratory distress. He has no wheezes. He has no rales. He exhibits no tenderness.  Abdominal: Soft. Bowel sounds are normal. He exhibits no distension and no mass. There is no splenomegaly or hepatomegaly. There is tenderness in the right upper quadrant, right lower quadrant, epigastric area, periumbilical area, left upper quadrant and left lower quadrant. There is rebound, guarding and tenderness at McBurney's point. No hernia.  Musculoskeletal: Normal range of motion.  Lymphadenopathy:    He has no cervical adenopathy.  Neurological: He is alert and oriented to person, place, and time.  Skin: Skin is warm and dry. No rash noted. He is not diaphoretic. No erythema. No pallor.  Psychiatric: He has a normal mood and affect. His behavior is normal. Judgment and thought content normal.  Vitals reviewed.      Assessment & Plan:  1. Generalized abdominal pain - Sudden onset nausea/vomiting/subjective fever and chills with appetite loss. He was tender throughout abdomen but severely tender in right lower quadrant. I cannot rule out appendicitis at this time. [facility] for further evaluation as I do not think his abdominal pain is from dry heaves.   Charge RN notified  2. Acute pansinusitis, recurrence not specified - Will treat depending on ER course.   Shirline Freesory Izaah Westman, NP

## 2015-11-29 NOTE — ED Provider Notes (Signed)
CSN: [id number]     Arrival date & time 11/29/15  1041 History   First MD Initiated Contact with Patient 11/29/15 1320     Chief Complaint  Patient presents with  . Abdominal Pain      HPI Patient comes in with history of sinus infection and coughing up green sputum but has had nausea vomiting and abdominal pain.  Was seen by his primary care doctor this morning and sent over for evaluation of possible appendicitis.  He has had decreased appetite and pain has localized right lower quadrant. Past Medical History  Diagnosis Date  . Fainting spell 10/2010  . Tonsil and adenoid disease, chronic   . Allergy   . Migraine    Past Surgical History  Procedure Laterality Date  . Tonsillectomy     Family History  Problem Relation Age of Onset  . Hypertension Mother   . Hypertension Father   . Arthritis Maternal Grandmother   . Cancer Maternal Grandmother     breast  . Hyperlipidemia Maternal Grandmother   . Hypertension Maternal Grandmother   . Hyperlipidemia Maternal Grandfather   . Hypertension Maternal Grandfather   . Diabetes Maternal Grandfather   . Hyperlipidemia Paternal Grandmother   . Hypertension Paternal Grandmother   . Cancer Paternal Grandfather     prostate  . Hyperlipidemia Paternal Grandfather   . Hypertension Paternal Grandfather    Social History  Substance Use Topics  . Smoking status: Never Smoker   . Smokeless tobacco: None  . Alcohol Use: None    Review of Systems  All other systems reviewed and are negative.     Allergies  Review of patient's allergies indicates no known allergies.  Home Medications   Prior to Admission medications   Medication Sig Start Date End Date Taking? Authorizing Provider  acetaminophen (TYLENOL) 500 MG tablet Take 1,000 mg by mouth every 6 (six) hours as needed for moderate pain.   Yes Historical Provider, MD  ibuprofen (ADVIL,MOTRIN) 200 MG tablet Take 800 mg by mouth every 6 (six) hours as needed for moderate pain.    Yes Historical Provider, MD  oxymetazoline (AFRIN) 0.05 % nasal spray Place 1 spray into both nostrils daily as needed for congestion.   Yes Historical Provider, MD  amoxicillin-clavulanate (AUGMENTIN) 875-125 MG tablet Take 1 tablet by mouth every 12 (twelve) hours. 11/29/15   Nelva Nayobert Melda Mermelstein, MD  HYDROcodone-homatropine [facility](HYCODAN) 5-1.5 MG/5ML syrup Take 5 mLs by mouth every 6 (six) hours as needed for cough. 11/29/15   Nelva Nayobert Chaylee Ehrsam, MD  neomycin-polymyxin b-dexamethasone (MAXITROL) 3.5-10000-0.1 OINT Place 1 application into both eyes daily.    Historical Provider, MD  ondansetron (ZOFRAN) 4 MG tablet Take 1 tablet (4 mg total) by mouth every 6 (six) hours. 11/29/15   Nelva Nayobert Lane Kjos, MD   BP 123/68 mmHg  Pulse 83  Temp(Src) 98.1 F (36.7 C) (Oral)  Resp 13  SpO2 99% Physical Exam  Constitutional: He is oriented to person, place, and time. He appears well-developed and well-nourished. No distress.  HENT:  Head: Normocephalic and atraumatic.  Eyes: Pupils are equal, round, and reactive to light.  Neck: Normal range of motion.  Cardiovascular: Normal rate and intact distal pulses.   Pulmonary/Chest: No respiratory distress.  Abdominal: Normal appearance. He exhibits no distension. There is tenderness in the right lower quadrant. There is no rigidity, no rebound and no guarding.  Musculoskeletal: Normal range of motion.  Neurological: He is alert and oriented to person, place, and time. No cranial nerve deficit.  Skin: Skin is warm and dry. No rash noted.  Psychiatric: He has a normal mood and affect. His behavior is normal.  Nursing note and vitals reviewed.   ED Course  Procedures (including critical care time) Labs Review Labs Reviewed  COMPREHENSIVE METABOLIC PANEL - Abnormal; Notable for the following:    Chloride 99 (*)    Glucose, Bld 105 (*)    All other components within normal limits  CBC - Abnormal; Notable for the following:    WBC 10.6 (*)    All other components within  normal limits  URINALYSIS, ROUTINE W REFLEX MICROSCOPIC (NOT AT [facility]) - Abnormal; Notable for the following:    Specific Gravity, Urine 1.031 (*)    All other components within normal limits  LIPASE, BLOOD    Imaging Review Dg Chest 2 View  11/29/2015  CLINICAL DATA:  Upper respiratory symptoms, cough and shortness of breath EXAM: CHEST  2 VIEW COMPARISON:  11/29/2015 FINDINGS: The heart size and mediastinal contours are within normal limits. Both lungs are clear. The visualized skeletal structures are unremarkable. IMPRESSION: No active cardiopulmonary disease. Electronically Signed   By: Judie Petit.  Shick M.D.   On: 11/29/2015 15:49   Ct Abdomen Pelvis W Contrast  11/29/2015  CLINICAL DATA:  Pt c/o gen abd pain with NVD x 2 days. Pt has had a sinus infection and has been coughing up streaks of blood. Pt sent here by doctor for CT scan to rule out appy. no prior abd surgery EXAM: CT ABDOMEN AND PELVIS WITH CONTRAST TECHNIQUE: Multidetector CT imaging of the abdomen and pelvis was performed using the standard protocol following bolus administration of intravenous contrast. CONTRAST:  ISOVUE-300 IOPAMIDOL (ISOVUE-300) INJECTION 61% COMPARISON:  None. FINDINGS: Lower chest:  No acute findings. Hepatobiliary: No masses or other significant abnormality. Pancreas: No mass, inflammatory changes, or other significant abnormality. Spleen: Within normal limits in size and appearance. Adrenals/Urinary Tract: No masses identified. No evidence of hydronephrosis. Stomach/Bowel: No evidence of obstruction, inflammatory process, or abnormal fluid collections. Normal appendix. Vascular/Lymphatic: No pathologically enlarged lymph nodes. No evidence of abdominal aortic aneurysm. Reproductive: No mass or other significant abnormality. Other: No ascites.  No free air. Musculoskeletal:  No suspicious bone lesions identified. IMPRESSION: 1. Negative.  Normal appendix. Electronically Signed   By: Corlis Leak M.D.   On: 11/29/2015  15:47   I have personally reviewed and evaluated these images and lab results as part of my medical decision-making.   EKG Interpretation None      MDM   Final diagnoses:  Acute maxillary sinusitis, recurrence not specified  Non-intractable vomiting with nausea, vomiting of unspecified type  Lower abdominal pain        Nelva Nay, MD 11/30/15 1510

## 2015-11-29 NOTE — ED Notes (Signed)
Patient transported to CT 

## 2015-11-30 ENCOUNTER — Ambulatory Visit: Payer: 59 | Admitting: Family Medicine

## 2016-01-25 ENCOUNTER — Telehealth: Payer: Self-pay | Admitting: Family Medicine

## 2016-01-25 NOTE — Telephone Encounter (Signed)
Informed mom that patient needs to get a PCP in [place] and get referral through them. Because they are [place] of state we cannot do the referral.

## 2016-01-25 NOTE — Telephone Encounter (Signed)
[place] hospital with GI issues.  Mom states he was in [facility] a few weeks ago with the same issue. Mom states pt needs referral to a GI dr, and they will not see pt without one.  But he is living in [place] now.   Advised mom not sure if we can do a referral from here, but would send the message.

## 2016-04-26 ENCOUNTER — Ambulatory Visit: Payer: 59 | Admitting: Adult Health

## 2016-05-22 ENCOUNTER — Ambulatory Visit: Attending: Otolaryngology

## 2016-05-22 DIAGNOSIS — K219 Gastro-esophageal reflux disease without esophagitis: Principal | ICD-10-CM

## 2016-05-22 DIAGNOSIS — R51 Headache: Secondary | ICD-10-CM

## 2016-05-22 DIAGNOSIS — B009 Herpesviral infection, unspecified: Principal | ICD-10-CM

## 2016-05-22 DIAGNOSIS — Z9109 Other allergy status, other than to drugs and biological substances: Secondary | ICD-10-CM

## 2016-05-22 MED ORDER — VALACYCLOVIR HCL 500 MG PO TABS
3 refills
Start: 2016-05-22 — End: ?

## 2016-05-22 MED ORDER — OMEPRAZOLE 40 MG PO CPDR
20 mg | Freq: Two times a day (BID) | ORAL | 1 refills | Status: CP
Start: 2016-05-22 — End: ?

## 2016-05-22 MED ORDER — RANITIDINE HCL 300 MG PO TABS
300 mg | ORAL_TABLET | Freq: Every evening | ORAL | 0 refills | Status: CP
Start: 2016-05-22 — End: ?

## 2016-05-22 NOTE — Progress Notes
Chief Complaint   Patient presents with   ? New Patient     pt is here for sore throfat   ? Pharyngitis     pt stated right side throat sore        History of Present Illness: 20 y.o.male with complaint of sore throat for one month, swollen uvula, whitish coating of tongue.  He has been on nystatin for a month.  His throat is always scratchy.  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  History of gastroesophogeal reflux.      Past Medical History:  Past Medical History:   Diagnosis Date   ? Allergy to environmental factors    ? GERD (gastroesophageal reflux disease)    ? Headache    ? Herpes        Past Surgical History:  Past Surgical History:   Procedure Laterality Date   ? ADENOIDECTOMY     ? TONSILLECTOMY         Allergies:  Review of patient's allergies indicates no known allergies.    Medication:  No current outpatient prescriptions on file prior to visit.     No current facility-administered medications on file prior to visit.        Social History   Substance Use Topics   ? Smoking status: Current Some Day Smoker   ? Smokeless tobacco: Current User   ? Alcohol use 4.5 oz/week     3 Shots of liquor per week      Comment: social       Family History   Problem Relation Age of Onset   ? Hypertension Mother    ? No Known Problems Father        Review of Systems:   No other complaints at this time      Physical Exam:    General:  well-nourished; no distress. Strong voice.  Skin: No suspicious lesions. No rashes. No abnormal moles.  Ears: no mass, inflammation or cerumen of external auditory canals.  TM intact, no perforation, fluid or retraction.  Nose:  No obstruction, normal mucosa, no drainage., no polypi  Nasopharynx: no mass, ulceration or purulent drainage.  Oral cavity/Oropharynx: No mass, ulceration of the floor of mouth, buccal mucosa or tongue.  Uvula somewhat elongated without swelling or erythema  Hypopharynx/Larynx:  No mass or ulceration of tongue base, vallecula.

## 2016-05-22 NOTE — Progress Notes
There is some very minor mucosal irregularity of the lateral wall of the right pyriforrm sinus supriorly.  There is some slight irregular mucosa of the superior pyriform sinus medially just inferior to the aryepiglottic fold.  Cords normal and move well in abduction and adduction  Neck:  No cervical mass or adenophathy.  Submaxillary glands and parotids symmetrical without mass lesions.  Thyroid not palpable.    Procedure:  Transnasal Flexible Fiberoptic Laryngoscopy ([id number])     Indication:     Anesthesia: Aerosolized topical nasal spray including LIDOCAINE and NEOSYNEPHRINE.  The flexible fiberoptic laryngoscope was passed via theright nasal cavity without complications.      Findings:   See above exam symmetric bilateral movement of the vocal folds.  Some minor mucosal areas noted without significant mass effect or ulcerations.  Second opinion obtained with Dr. London SheerFaker scoping the patient as well.  Findings may well be compatable with patients GERD.      Patient tolerated the procedure with a 0/10 pain score.        DIAGNOSIS: Gastroesophogeal reflus    PLAN:treat medically for 8 weeks and re evaluate    Manley MasonNelson C Goldman

## 2016-06-28 NOTE — Progress Notes
Appointment Date: Future Appointments  Date Time Provider Department Center   07/10/2016 8:00 AM Manley MasonGoldman, Nelson C, MD [facility]       Referring Physician:None     PCP: Patient, None Per    Chief Complaint:8 weeks follow up for after treatment for Chronic GERD    Allergies: No Known Allergies    Labs: No results found for any previous visit.    Pathology: n/a    Radiology:n/a    Other Diagnostic Testing: n/a    Insurance :  [employer]/POS    Pharmacy:   [id number] [place] - [phone] [facility] [address] [facility] [address]  [address]  [place] [facility] [place] [id number]  Phone: [phone] Fax: [phone]      Checked By: Phill MutterYoung Owens, MA    Date: 06/28/2016

## 2016-07-10 ENCOUNTER — Ambulatory Visit: Attending: Otolaryngology

## 2016-07-10 DIAGNOSIS — R51 Headache: Secondary | ICD-10-CM

## 2016-07-10 DIAGNOSIS — Z9109 Other allergy status, other than to drugs and biological substances: Secondary | ICD-10-CM

## 2016-07-10 DIAGNOSIS — K219 Gastro-esophageal reflux disease without esophagitis: Secondary | ICD-10-CM

## 2016-07-10 DIAGNOSIS — B009 Herpesviral infection, unspecified: Principal | ICD-10-CM

## 2016-07-10 NOTE — Progress Notes
This 20 yo male was placed on medication for reflux pharyngitis.  He states he is greatly improved.  Exam reveals normal pharynx.  He was advised to put blocks under the head posts of his beds and follow up with his LMD when he moves to [place].

## 2016-07-21 IMAGING — CT CT ABD-PELV W/ CM
2 of 4 series · 17 of 46 positions shown, 19 images · IV contrast (ISOVUE)
Comparison: None.

CLINICAL DATA: Pt c/o gen abd pain with NVD x 2 days. Pt has had a
sinus infection and has been coughing up streaks of blood. Pt sent
here by doctor for CT scan to rule out appy. no prior abd surgery

EXAM:
CT ABDOMEN AND PELVIS WITH CONTRAST
TECHNIQUE: Multidetector CT imaging of the abdomen and pelvis was performed
using the standard protocol following bolus administration of
intravenous contrast.
CONTRAST:  100mL VMJXDO-8ZZ IOPAMIDOL (VMJXDO-8ZZ) INJECTION 61%

[Series 2: abd/pel with · axial · 0.78mm/px · z∈[-314,+106]mm · 14 of 96 slices shown, 16 images]
[im 6/96  soft-tissue]
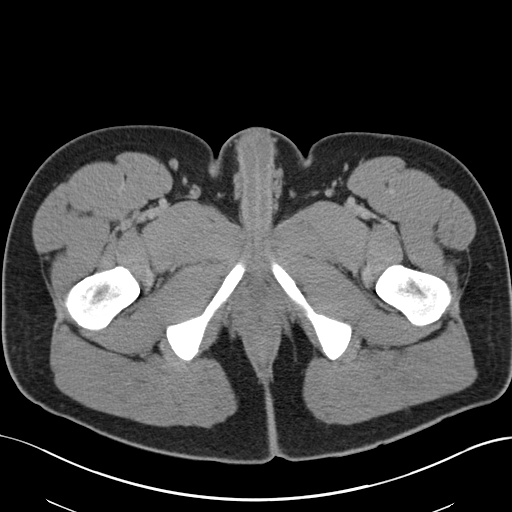
[im 6/96  bone]
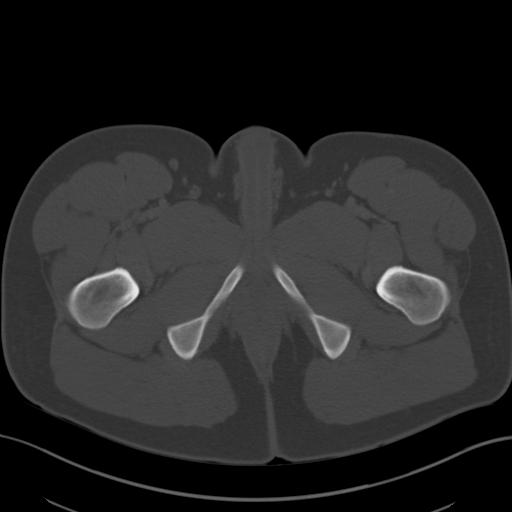
[im 11/96  soft-tissue]
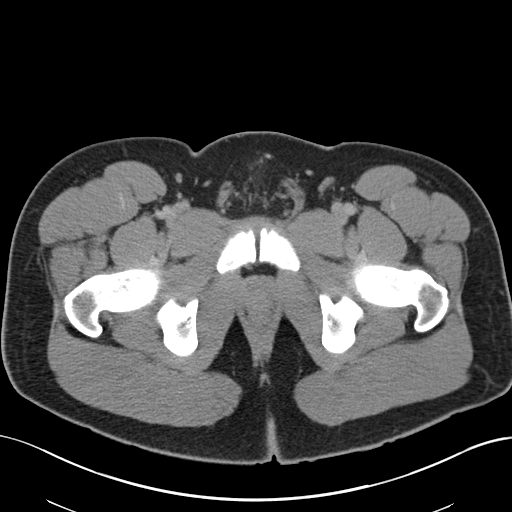
[im 22/96  soft-tissue]
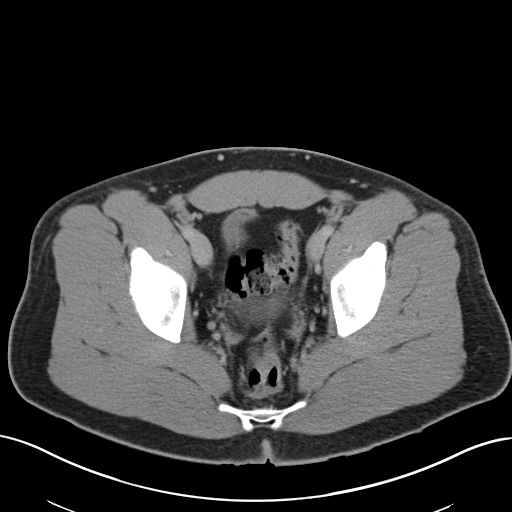
[im 27/96  soft-tissue]
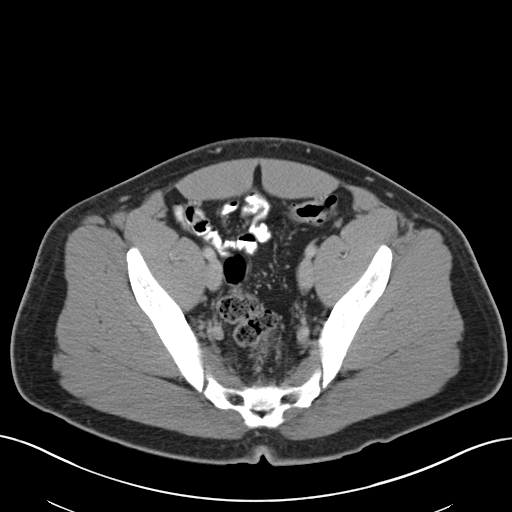
[im 32/96  soft-tissue]
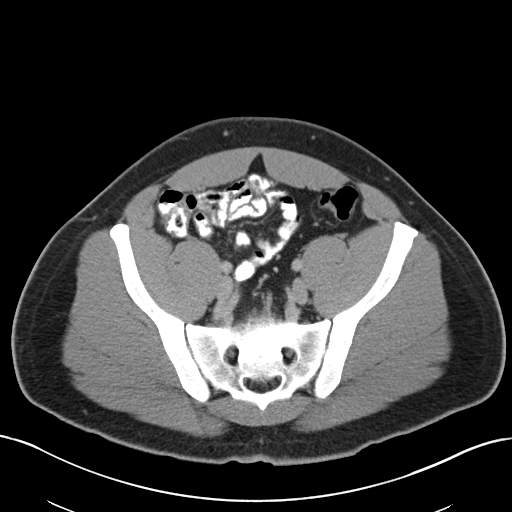
[im 37/96  soft-tissue]
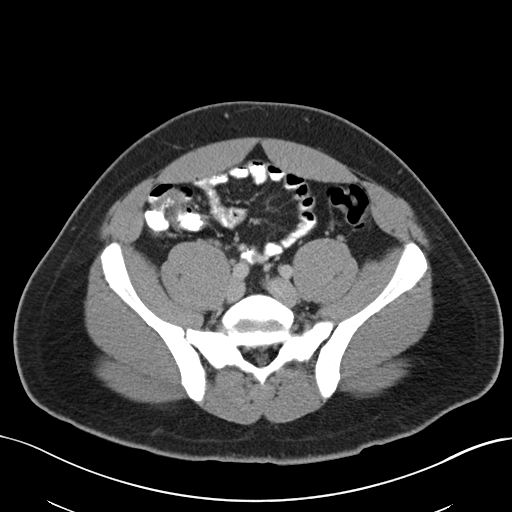
[im 43/96  soft-tissue]
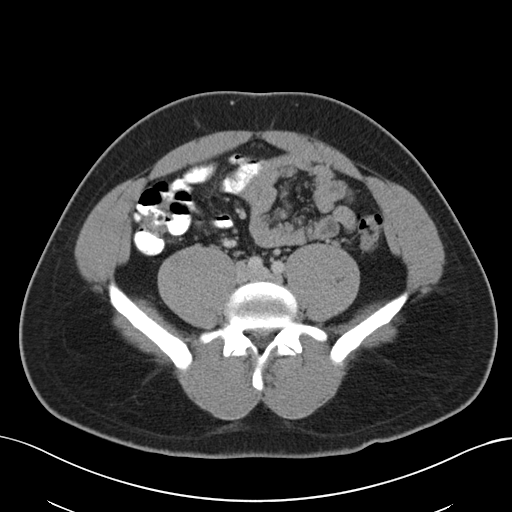
[im 53/96  soft-tissue]
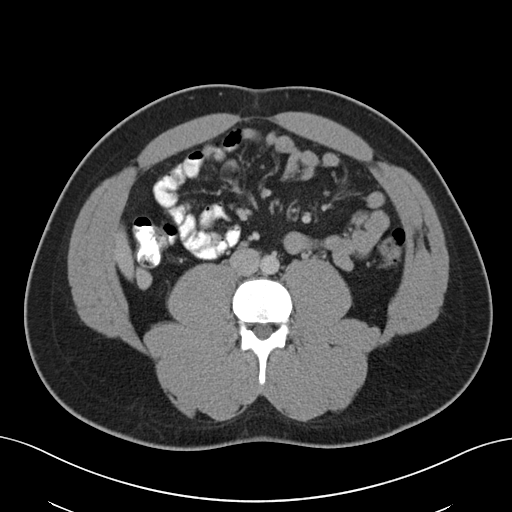
[im 59/96  soft-tissue]
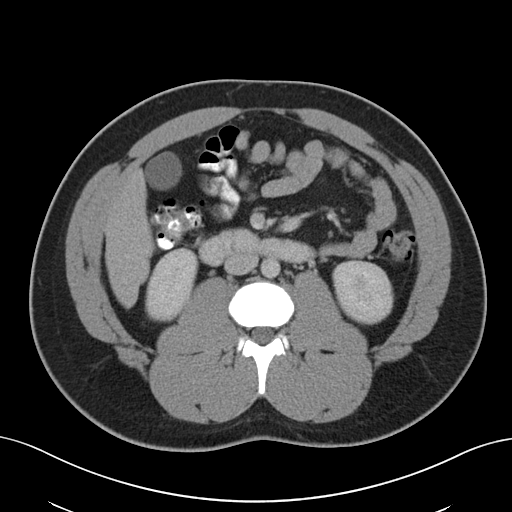
[im 59/96  bone]
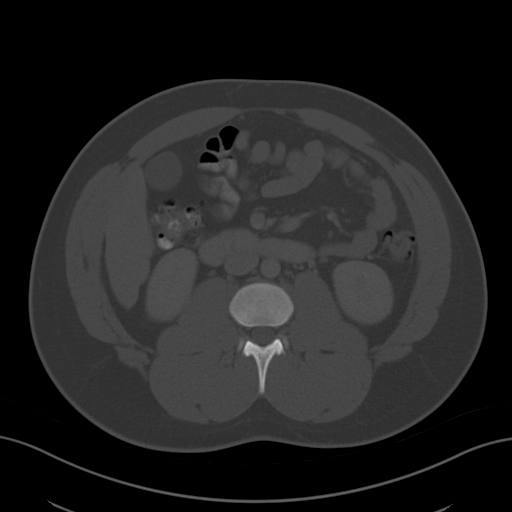
[im 64/96  soft-tissue]
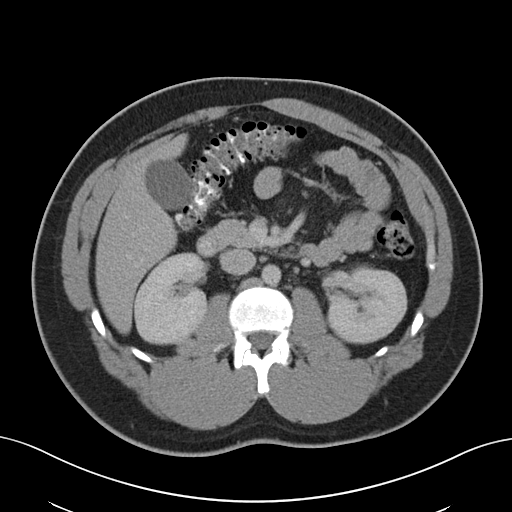
[im 69/96  soft-tissue]
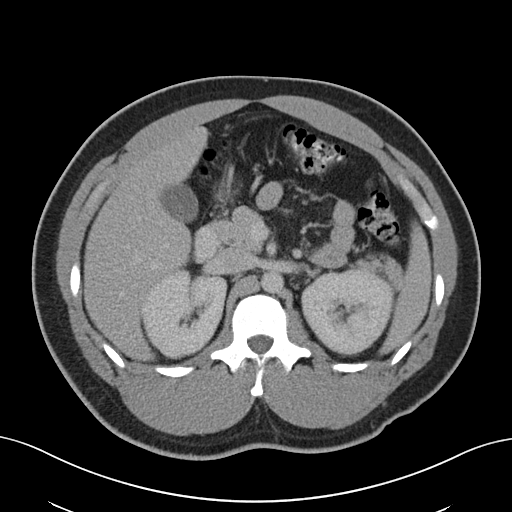
[im 74/96  soft-tissue]
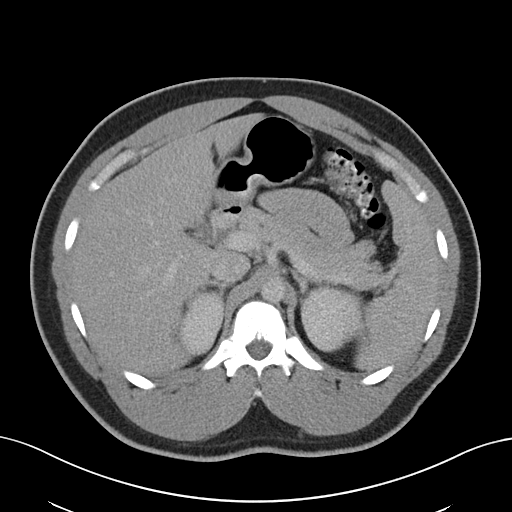
[im 85/96  soft-tissue]
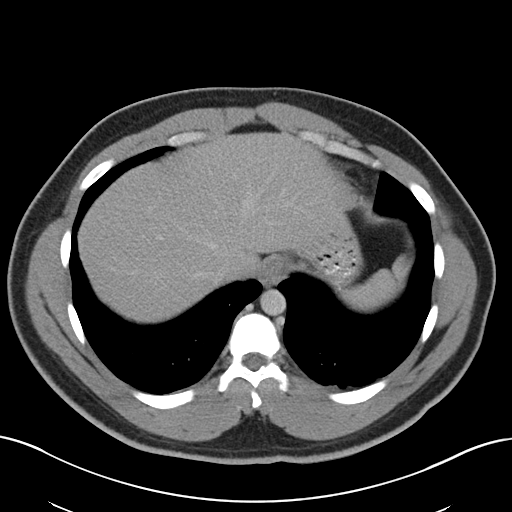
[im 90/96  soft-tissue]
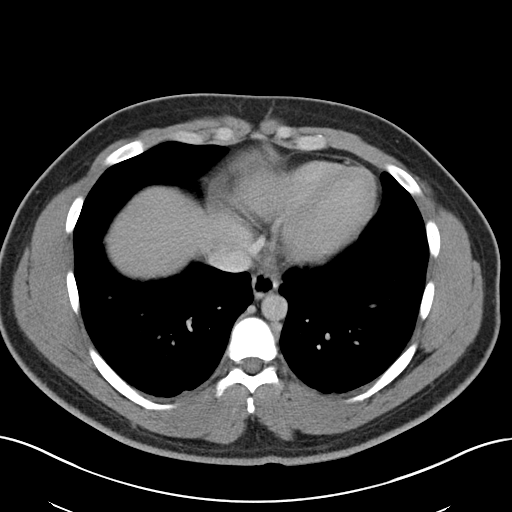

[Series 3: coronal a/|p · coronal · 0.80mm/px · 3 of 150 slices shown]
[im 50/150  soft-tissue]
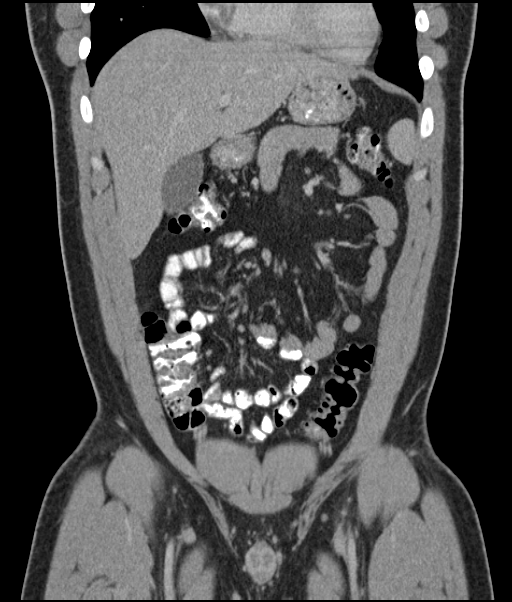
[im 67/150  soft-tissue]
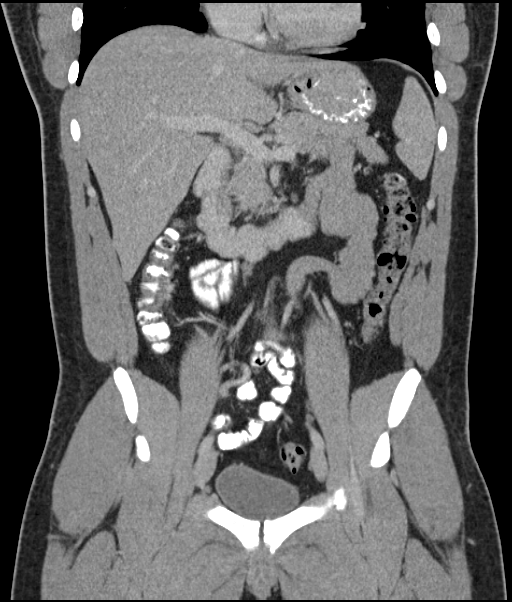
[im 83/150  soft-tissue]
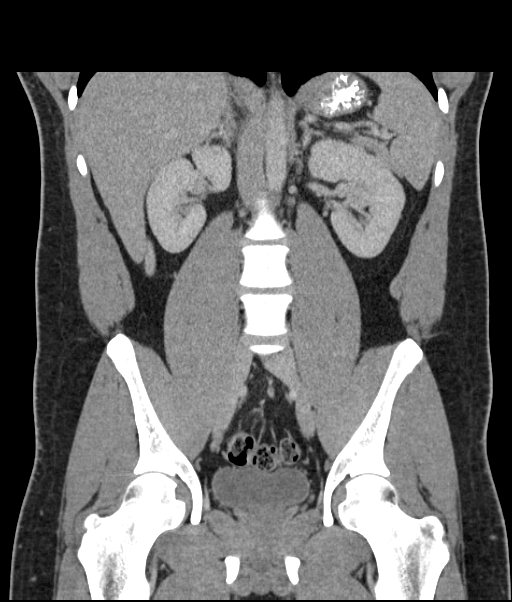

[17 of 46 positions shown; findings below may reference images not displayed]

FINDINGS: Lower chest:  No acute findings.

Hepatobiliary: No masses or other significant abnormality.

Pancreas: No mass, inflammatory changes, or other significant
abnormality.

Spleen: Within normal limits in size and appearance.

Adrenals/Urinary Tract: No masses identified. No evidence of
hydronephrosis.

Stomach/Bowel: No evidence of obstruction, inflammatory process, or
abnormal fluid collections. Normal appendix.

Vascular/Lymphatic: No pathologically enlarged lymph nodes. No
evidence of abdominal aortic aneurysm.

Reproductive: No mass or other significant abnormality.

Other: No ascites.  No free air.

Musculoskeletal:  No suspicious bone lesions identified.
IMPRESSION: 1. Negative.  Normal appendix.

## 2019-01-05 ENCOUNTER — Ambulatory Visit (INDEPENDENT_AMBULATORY_CARE_PROVIDER_SITE_OTHER): Payer: 59 | Admitting: Family Medicine

## 2019-01-05 ENCOUNTER — Encounter: Payer: Self-pay | Admitting: Family Medicine

## 2019-01-05 ENCOUNTER — Other Ambulatory Visit: Payer: Self-pay

## 2019-01-05 DIAGNOSIS — S161XXA Strain of muscle, fascia and tendon at neck level, initial encounter: Secondary | ICD-10-CM | POA: Diagnosis not present

## 2019-01-05 DIAGNOSIS — S29012A Strain of muscle and tendon of back wall of thorax, initial encounter: Secondary | ICD-10-CM | POA: Diagnosis not present

## 2019-01-05 DIAGNOSIS — T148XXA Other injury of unspecified body region, initial encounter: Secondary | ICD-10-CM

## 2019-01-05 NOTE — Progress Notes (Addendum)
Virtual Visit via Video Note  I connected with James Mckenzie on 01/05/19 at  8:30 AM EDT by a video enabled telemedicine application and verified that I am speaking with the correct person using two identifiers.  Location patient: home Location provider:work or home office Persons participating in the virtual visit: patient, provider  I discussed the limitations of evaluation and management by telemedicine and the availability of in person appointments. The patient expressed understanding and agreed to proceed.   HPI: Pt having muscle stiffness in neck.  Started 2-3 wks.   Pt states his neck is feeling better.  Cannot recall injury.  Thinks may have slept on it wrong.  Occasional stiffness in the top of neck and shoulder blades.  Pt tried Ibuprofen.  Pt denies pain, fever, chills, n/v.  Pt inquires what he can do in the future if this happens again.   ROS: See pertinent positives and negatives per HPI.  Past Medical History:  Diagnosis Date  . Allergy   . Fainting spell 10/2010  . Migraine   . Tonsil and adenoid disease, chronic     Past Surgical History:  Procedure Laterality Date  . TONSILLECTOMY      Family History  Problem Relation Age of Onset  . Hypertension Mother   . Hypertension Father   . Arthritis Maternal Grandmother   . Cancer Maternal Grandmother        breast  . Hyperlipidemia Maternal Grandmother   . Hypertension Maternal Grandmother   . Hyperlipidemia Maternal Grandfather   . Hypertension Maternal Grandfather   . Diabetes Maternal Grandfather   . Hyperlipidemia Paternal Grandmother   . Hypertension Paternal Grandmother   . Cancer Paternal Grandfather        prostate  . Hyperlipidemia Paternal Grandfather   . Hypertension Paternal Grandfather      Current Outpatient Medications:  .  acetaminophen (TYLENOL) 500 MG tablet, Take 1,000 mg by mouth every 6 (six) hours as needed for moderate pain., Disp: , Rfl:  .  amoxicillin-clavulanate (AUGMENTIN)  875-125 MG tablet, Take 1 tablet by mouth every 12 (twelve) hours., Disp: 14 tablet, Rfl: 0 .  HYDROcodone-homatropine (HYCODAN) 5-1.5 MG/5ML syrup, Take 5 mLs by mouth every 6 (six) hours as needed for cough., Disp: 120 mL, Rfl: 0 .  ibuprofen (ADVIL,MOTRIN) 200 MG tablet, Take 800 mg by mouth every 6 (six) hours as needed for moderate pain., Disp: , Rfl:  .  neomycin-polymyxin b-dexamethasone (MAXITROL) 3.5-10000-0.1 OINT, Place 1 application into both eyes daily., Disp: , Rfl:  .  ondansetron (ZOFRAN) 4 MG tablet, Take 1 tablet (4 mg total) by mouth every 6 (six) hours., Disp: 12 tablet, Rfl: 0 .  oxymetazoline (AFRIN) 0.05 % nasal spray, Place 1 spray into both nostrils daily as needed for congestion., Disp: , Rfl:   EXAM:  VITALS per patient if applicable: RR between 81-82 bpm  GENERAL: alert, oriented, appears well and in no acute distress  HEENT: atraumatic, conjunctiva clear, no obvious abnormalities on inspection of external nose and ears  NECK: normal movements of the head and neck  LUNGS: on inspection no signs of respiratory distress, breathing rate appears normal, no obvious gross SOB, gasping or wheezing  CV: no obvious cyanosis  MS: moves all visible extremities without noticeable abnormality  PSYCH/NEURO: pleasant and cooperative, no obvious depression or anxiety, speech and thought processing grossly intact  ASSESSMENT AND PLAN:  Discussed the following assessment and plan:  Muscle strain -strain in neck and upper back -improving -discussed supportive  care:  NSAIDs, stretching, heat, massage, OTC topical meds -pt advised to try a different pillow on bed -given precautions -f/u prn    I discussed the assessment and treatment plan with the patient. The patient was provided an opportunity to ask questions and all were answered. The patient agreed with the plan and demonstrated an understanding of the instructions.   The patient was advised to call back or seek an  in-person evaluation if the symptoms worsen or if the condition fails to improve as anticipated.  Deeann SaintShannon R Ezechiel Stooksbury, MD

## 2019-03-24 ENCOUNTER — Other Ambulatory Visit: Payer: Self-pay

## 2019-03-24 ENCOUNTER — Ambulatory Visit: Payer: 59 | Admitting: Family Medicine

## 2019-03-24 ENCOUNTER — Encounter: Payer: Self-pay | Admitting: Family Medicine

## 2019-03-24 VITALS — BP 118/86 | HR 90 | Temp 98.2°F | Ht 71.0 in | Wt 268.1 lb

## 2019-03-24 DIAGNOSIS — R0789 Other chest pain: Secondary | ICD-10-CM

## 2019-03-24 DIAGNOSIS — E669 Obesity, unspecified: Secondary | ICD-10-CM

## 2019-03-24 DIAGNOSIS — Z23 Encounter for immunization: Secondary | ICD-10-CM | POA: Diagnosis not present

## 2019-03-24 NOTE — Addendum Note (Signed)
Addended by: Anibal Henderson on: 03/24/2019 08:57 AM   Modules accepted: Orders

## 2019-03-24 NOTE — Progress Notes (Signed)
  Subjective:     Patient ID: James Mckenzie, male   DOB: 1996/06/12, 23 y.o.   MRN: [id number]  HPI Patient is seen basically requesting some paperwork be completed so that he can donate plasma.  He donates twice per month and has been doing so without difficulty.  Patient did have recent ER visit for atypical chest pain.  EKG showed some nonspecific changes.  Troponins were negative.  He was given Toradol and improved promptly and this was determined to be musculoskeletal.  Has not had exertional chest pain since then.  No pleuritic pain.  No dyspnea.  No fevers, chills, or cough.  He has history of migraine headaches and does not take any medication regularly for those.  Otherwise no chronic problems.  Non-smoker.  Past Medical History:  Diagnosis Date  . Allergy   . Fainting spell 10/2010  . Migraine   . Tonsil and adenoid disease, chronic    Past Surgical History:  Procedure Laterality Date  . DENTAL SURGERY    . TONSILLECTOMY      reports that he has been smoking cigars. He has never used smokeless tobacco. He reports current alcohol use of about 2.0 standard drinks of alcohol per week. He reports that he does not use drugs. family history includes Arthritis in his maternal grandmother; Cancer in his maternal grandmother and paternal grandfather; Diabetes in his maternal grandfather; Hyperlipidemia in his maternal grandfather, maternal grandmother, paternal grandfather, and paternal grandmother; Hypertension in his father, maternal grandfather, maternal grandmother, mother, paternal grandfather, and paternal grandmother. No Known Allergies   Review of Systems  Constitutional: Negative for chills, fatigue, fever and unexpected weight change.  Eyes: Negative for visual disturbance.  Respiratory: Negative for cough, chest tightness and shortness of breath.   Cardiovascular: Negative for chest pain, palpitations and leg swelling.  Gastrointestinal: Negative for abdominal pain.   Endocrine: Negative for polydipsia and polyuria.  Neurological: Negative for dizziness, syncope, weakness, light-headedness and headaches.       Objective:   Physical Exam Constitutional:      Appearance: He is well-developed.  HENT:     Right Ear: External ear normal.     Left Ear: External ear normal.  Eyes:     Pupils: Pupils are equal, round, and reactive to light.  Neck:     Musculoskeletal: Neck supple.     Thyroid: No thyromegaly.  Cardiovascular:     Rate and Rhythm: Normal rate and regular rhythm.  Pulmonary:     Effort: Pulmonary effort is normal. No respiratory distress.     Breath sounds: Normal breath sounds. No wheezing or rales.  Neurological:     Mental Status: He is alert and oriented to person, place, and time.        Assessment:     #1 recent atypical chest pain.  ER notes were reviewed from [facility].  His labs were all unremarkable.  EKG showed nonspecific changes.  Troponin is negative.  He is not having any worrisome symptoms at this time  #2 obesity.  Patient had questions regarding weight loss strategies    Plan:     -Continue regular exercise habits. -We suggested regular calorie counting to help guide his intake and expenditures -Follow-up for any exertional chest pain or other concerns.  Forms completed for plasma donations.  No known contraindications.  Eulas Post MD [facility]

## 2019-07-03 ENCOUNTER — Encounter: Payer: Self-pay | Admitting: Family Medicine

## 2019-07-03 ENCOUNTER — Ambulatory Visit: Payer: 59 | Admitting: Family Medicine

## 2019-07-03 ENCOUNTER — Other Ambulatory Visit: Payer: Self-pay

## 2019-07-03 VITALS — BP 118/78 | HR 94 | Temp 97.2°F | Ht 71.0 in | Wt 274.5 lb

## 2019-07-03 DIAGNOSIS — H6983 Other specified disorders of Eustachian tube, bilateral: Secondary | ICD-10-CM | POA: Diagnosis not present

## 2019-07-03 DIAGNOSIS — F411 Generalized anxiety disorder: Secondary | ICD-10-CM

## 2019-07-03 DIAGNOSIS — R253 Fasciculation: Secondary | ICD-10-CM | POA: Diagnosis not present

## 2019-07-03 MED ORDER — ESCITALOPRAM OXALATE 10 MG PO TABS: 10.0000 mg | ORAL_TABLET | Freq: Every day | ORAL | 11 refills | Status: DC

## 2019-07-03 NOTE — Progress Notes (Signed)
Subjective:     Patient ID: James Mckenzie, male   DOB: 10-20-1995, 23 y.o.   MRN: [id number]  HPI   James Mckenzie is here with multiple recent somatic complaints.  He states that he had some type of dental infection back in May and he relates many of his physical symptoms related to that time..  He apparently had dental x-rays which showed possible sinus issue and was referred to ENT.  He had CT of sinuses but no significant infection.  He was told he had a "deviated nasal septum".  He eventually was referred to neurology at [facility] for some intermittent headaches.  He had MRI of the brain in June which was unremarkable.  No significant disorder found.  He is seen now with issue of involuntary fasciculations of the eyelids.  No muscle cramps.  No true blurred vision.  He states he has difficulty sometimes early in the morning "focusing ".  No diplopia. He has also noticed that frequently when he opens and closes jaw he feels a popping sensation in the ear.  Denies any major nasal congestive symptoms.  He relates increased anxiety symptoms fairly pervasively most days.  He states he has general worry about most things.  No social anxiety.  Denies depression symptoms As he puts it, he is "over thinking "things with regard to work and his personal life  Past Medical History:  Diagnosis Date  . Allergy   . Fainting spell 10/2010  . Migraine   . Tonsil and adenoid disease, chronic    Past Surgical History:  Procedure Laterality Date  . DENTAL SURGERY    . TONSILLECTOMY      reports that he has been smoking cigars. He has never used smokeless tobacco. He reports current alcohol use of about 2.0 standard drinks of alcohol per week. He reports that he does not use drugs. family history includes Arthritis in his maternal grandmother; Cancer in his maternal grandmother and paternal grandfather; Diabetes in his maternal grandfather; Hyperlipidemia in his maternal grandfather, maternal grandmother,  paternal grandfather, and paternal grandmother; Hypertension in his father, maternal grandfather, maternal grandmother, mother, paternal grandfather, and paternal grandmother. No Known Allergies   Review of Systems  Constitutional: Negative for chills and fever.  HENT: Negative for congestion, ear discharge, ear pain, hearing loss, sinus pressure and sinus pain.   Eyes: Negative for photophobia, pain, discharge and itching.  Respiratory: Negative for cough and shortness of breath.   Cardiovascular: Negative for chest pain.  Neurological: Negative for dizziness, facial asymmetry, speech difficulty and headaches.  Psychiatric/Behavioral: Negative for dysphoric mood. The patient is nervous/anxious.        Objective:   Physical Exam Vitals reviewed.  Constitutional:      Appearance: Normal appearance.  HENT:     Head: Normocephalic and atraumatic.     Right Ear: Tympanic membrane and ear canal normal.     Left Ear: Tympanic membrane and ear canal normal.  Eyes:     Extraocular Movements: Extraocular movements intact.     Pupils: Pupils are equal, round, and reactive to light.  Cardiovascular:     Rate and Rhythm: Normal rate and regular rhythm.     Heart sounds: No murmur. No gallop.   Pulmonary:     Effort: Pulmonary effort is normal.     Breath sounds: Normal breath sounds.  Musculoskeletal:     Cervical back: Neck supple.  Lymphadenopathy:     Cervical: No cervical adenopathy.  Neurological:  Mental Status: He is alert.        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  Visual acuity is normal  #2 probable eustachian tube dysfunction bilaterally  #3 generalized anxiety symptoms.  Suspect probable generalized anxiety disorder.  Symptoms progressive and worsening over the past several months    Plan:     -Reassurance regarding eye symptoms -Consider over-the-counter Flonase nasal for ear symptoms -Discussed options for his anxiety symptoms.   These are pervasive, progressive, and daily.  We recommended trial of Lexapro 10 mg once daily with prescription written and follow-up in 1 month for virtual or office follow-up.  Kristian Covey MD [facility] at [facility]

## 2019-07-03 NOTE — Patient Instructions (Addendum)
Try over the counter Flonase nasal for ear symptoms  Eye symptoms are likely benign fasciculations.    Generalized Anxiety Disorder, Adult Generalized anxiety disorder (GAD) is a mental health disorder. People with this condition constantly worry about everyday events. Unlike normal anxiety, worry related to GAD is not triggered by a specific event. These worries also do not fade or get better with time. GAD interferes with life functions, including relationships, work, and school. GAD can vary from mild to severe. People with severe GAD can have intense waves of anxiety with physical symptoms (panic attacks). What are the causes? The exact cause of GAD is not known. What increases the risk? This condition is more likely to develop in:  Women.  People who have a family history of anxiety disorders.  People who are very shy.  People who experience very stressful life events, such as the death of a loved one.  People who have a very stressful family environment. What are the signs or symptoms? People with GAD often worry excessively about many things in their lives, such as their health and family. They may also be overly concerned about:  Doing well at work.  Being on time.  Natural disasters.  Friendships. Physical symptoms of GAD include:  Fatigue.  Muscle tension or having muscle twitches.  Trembling or feeling shaky.  Being easily startled.  Feeling like your heart is pounding or racing.  Feeling out of breath or like you cannot take a deep breath.  Having trouble falling asleep or staying asleep.  Sweating.  Nausea, diarrhea, or irritable bowel syndrome (IBS).  Headaches.  Trouble concentrating or remembering facts.  Restlessness.  Irritability. How is this diagnosed? Your health care provider can diagnose GAD based on your symptoms and medical history. You will also have a physical exam. The health care provider will ask specific questions about your  symptoms, including how severe they are, when they started, and if they come and go. Your health care provider may ask you about your use of alcohol or drugs, including prescription medicines. Your health care provider may refer you to a mental health specialist for further evaluation. Your health care provider will do a thorough examination and may perform additional tests to rule out other possible causes of your symptoms. To be diagnosed with GAD, a person must have anxiety that:  Is out of his or her control.  Affects several different aspects of his or her life, such as work and relationships.  Causes distress that makes him or her unable to take part in normal activities.  Includes at least three physical symptoms of GAD, such as restlessness, fatigue, trouble concentrating, irritability, muscle tension, or sleep problems. Before your health care provider can confirm a diagnosis of GAD, these symptoms must be present more days than they are not, and they must last for six months or longer. How is this treated? The following therapies are usually used to treat GAD:  Medicine. Antidepressant medicine is usually prescribed for long-term daily control. Antianxiety medicines may be added in severe cases, especially when panic attacks occur.  Talk therapy (psychotherapy). Certain types of talk therapy can be helpful in treating GAD by providing support, education, and guidance. Options include: ? Cognitive behavioral therapy (CBT). People learn coping skills and techniques to ease their anxiety. They learn to identify unrealistic or negative thoughts and behaviors and to replace them with positive ones. ? Acceptance and commitment therapy (ACT). This treatment teaches people how to be mindful as a  way to cope with unwanted thoughts and feelings. ? Biofeedback. This process trains you to manage your body's response (physiological response) through breathing techniques and relaxation methods. You  will work with a therapist while machines are used to monitor your physical symptoms.  Stress management techniques. These include yoga, meditation, and exercise. A mental health specialist can help determine which treatment is best for you. Some people see improvement with one type of therapy. However, other people require a combination of therapies. Follow these instructions at home:  Take over-the-counter and prescription medicines only as told by your health care provider.  Try to maintain a normal routine.  Try to anticipate stressful situations and allow extra time to manage them.  Practice any stress management or self-calming techniques as taught by your health care provider.  Do not punish yourself for setbacks or for not making progress.  Try to recognize your accomplishments, even if they are small.  Keep all follow-up visits as told by your health care provider. This is important. Contact a health care provider if:  Your symptoms do not get better.  Your symptoms get worse.  You have signs of depression, such as: ? A persistently sad, cranky, or irritable mood. ? Loss of enjoyment in activities that used to bring you joy. ? Change in weight or eating. ? Changes in sleeping habits. ? Avoiding friends or family members. ? Loss of energy for normal tasks. ? Feelings of guilt or worthlessness. Get help right away if:  You have serious thoughts about hurting yourself or others. If you ever feel like you may hurt yourself or others, or have thoughts about taking your own life, get help right away. You can go to your nearest emergency department or call:  Your local emergency services (911 in the [place]  A suicide crisis helpline, such as the National Suicide Prevention Lifeline at 6023004022. This is open 24 hours a day. Summary  Generalized anxiety disorder (GAD) is a mental health disorder that involves worry that is not triggered by a specific event.  People  with GAD often worry excessively about many things in their lives, such as their health and family.  GAD may cause physical symptoms such as restlessness, trouble concentrating, sleep problems, frequent sweating, nausea, diarrhea, headaches, and trembling or muscle twitching.  A mental health specialist can help determine which treatment is best for you. Some people see improvement with one type of therapy. However, other people require a combination of therapies. This information is not intended to replace advice given to you by your health care provider. Make sure you discuss any questions you have with your health care provider. Document Released: 11/03/2012 Document Revised: 06/21/2017 Document Reviewed: 05/29/2016 Elsevier Patient Education  2020 [employer].

## 2020-01-22 ENCOUNTER — Other Ambulatory Visit: Payer: Self-pay

## 2020-01-22 ENCOUNTER — Ambulatory Visit: Payer: 59 | Admitting: Family Medicine

## 2020-01-22 ENCOUNTER — Encounter: Payer: Self-pay | Admitting: Family Medicine

## 2020-01-22 VITALS — BP 138/76 | HR 97 | Temp 98.3°F | Wt 268.5 lb

## 2020-01-22 DIAGNOSIS — N5089 Other specified disorders of the male genital organs: Secondary | ICD-10-CM | POA: Diagnosis not present

## 2020-01-22 NOTE — Progress Notes (Signed)
Established Patient Office Visit  Subjective:  Patient ID: James Mckenzie, male    DOB: 07/06/1996  Age: 24 y.o. MRN: [id number]  CC:  Chief Complaint  Patient presents with  . Mass    on testicle for 2 months     HPI James Mckenzie presents for 3 to 4-month history of intermittent pain left testicle region.  He notes a small lump in that region couple months ago.  He states he went to urgent care about 2 months ago and had some STD testing which was negative.  He has only 1 partner which is his fiance.  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  No recent appetite or weight changes.  No adenopathy.  No fevers or chills.  No night sweats.  No dyspnea.  No clear exacerbating or alleviating factors with his testicular pain.  Denies any recent injury.  We reviewed his recent STD testing which was all negative  Past Medical History:  Diagnosis Date  . Allergy   . Fainting spell 10/2010  . Migraine   . Tonsil and adenoid disease, chronic     Past Surgical History:  Procedure Laterality Date  . DENTAL SURGERY    . TONSILLECTOMY      Family History  Problem Relation Age of Onset  . Hypertension Mother   . Hypertension Father   . Arthritis Maternal Grandmother   . Cancer Maternal Grandmother        breast  . Hyperlipidemia Maternal Grandmother   . Hypertension Maternal Grandmother   . Hyperlipidemia Maternal Grandfather   . Hypertension Maternal Grandfather   . Diabetes Maternal Grandfather   . Hyperlipidemia Paternal Grandmother   . Hypertension Paternal Grandmother   . Cancer Paternal Grandfather        prostate  . Hyperlipidemia Paternal Grandfather   . Hypertension Paternal Grandfather     Social History   Socioeconomic History  . Marital status: Single    Spouse name: Not on file  . Number of children: Not on file  . Years of education: Not on file  . Highest education level: Not on file  Occupational History  . Not on file  Tobacco Use  .  Smoking status: Current Some Day Smoker    Types: Cigars  . Smokeless tobacco: Never Used  Vaping Use  . Vaping Use: Never used  Substance and Sexual Activity  . Alcohol use: Yes    Alcohol/week: 2.0 standard drinks    Types: 2 Glasses of wine per week    Comment: Daily  . Drug use: Never  . Sexual activity: Yes    Birth control/protection: I.U.D.  Other Topics Concern  . Not on file  Social History Narrative  . Not on file   Social Determinants of Health   Financial Resource Strain:   . Difficulty of Paying Living Expenses:   Food Insecurity:   . Worried About Programme researcher, broadcasting/film/video in the Last Year:   . Barista in the Last Year:   Transportation Needs:   . Freight forwarder (Medical):   Marland Kitchen Lack of Transportation (Non-Medical):   Physical Activity:   . Days of Exercise per Week:   . Minutes of Exercise per Session:   Stress:   . Feeling of Stress :   Social Connections:   . Frequency of Communication with Friends and Family:   . Frequency of Social Gatherings with Friends and Family:   . Attends Religious Services:   .  Active Member of Clubs or Organizations:   . Attends Banker Meetings:   Marland Kitchen Marital Status:   Intimate Partner Violence:   . Fear of Current or Ex-Partner:   . Emotionally Abused:   Marland Kitchen Physically Abused:   . Sexually Abused:     Outpatient Medications Prior to Visit  Medication Sig Dispense Refill  . acetaminophen (TYLENOL) 500 MG tablet Take 1,000 mg by mouth every 6 (six) hours as needed for moderate pain.    Marland Kitchen acyclovir (ZOVIRAX) 400 MG tablet     . escitalopram (LEXAPRO) 10 MG tablet Take 1 tablet (10 mg total) by mouth daily. 30 tablet 11  . fluticasone (FLONASE) 50 MCG/ACT nasal spray APP 1 OR 2 SPRAYS IEN BID    . ibuprofen (ADVIL,MOTRIN) 200 MG tablet Take 800 mg by mouth every 6 (six) hours as needed for moderate pain.    Marland Kitchen omeprazole (PRILOSEC) 40 MG capsule Take by mouth.    Marland Kitchen oxymetazoline (AFRIN) 0.05 % nasal  spray Place 1 spray into both nostrils daily as needed for congestion.     No facility-administered medications prior to visit.    No Known Allergies  ROS Review of Systems  Constitutional: Negative for appetite change, chills, fever and unexpected weight change.  Respiratory: Negative for shortness of breath.   Cardiovascular: Negative for chest pain.  Genitourinary: Positive for testicular pain. Negative for discharge, dysuria, genital sores and hematuria.  Hematological: Negative for adenopathy.      Objective:    Physical Exam Vitals reviewed.  Constitutional:      Appearance: Normal appearance.  Cardiovascular:     Rate and Rhythm: Normal rate and regular rhythm.  Pulmonary:     Effort: Pulmonary effort is normal.     Breath sounds: Normal breath sounds.  Genitourinary:    Comments: Testes are normal in size.  No hernia.  No right testicle mass.  He has very small circumferential area of swelling along the inferior somewhat posterior aspect of the left testicle and difficult to sort out if this is attached to the testicle or not.  This is by palpation just a few millimeters in estimated size.  No epididymis tenderness Neurological:     Mental Status: He is alert.     BP 138/76 (BP Location: Left Arm, Patient Position: Sitting, Cuff Size: Normal)   Pulse 97   Temp 98.3 F (36.8 C) (Temporal)   Wt 268 lb 8 oz (121.8 kg)   SpO2 97%   BMI 37.45 kg/m  Wt Readings from Last 3 Encounters:  01/22/20 268 lb 8 oz (121.8 kg)  07/03/19 274 lb 8 oz (124.5 kg)  03/24/19 268 lb 1.6 oz (121.6 kg)     Health Maintenance Due  Topic Date Due  . Hepatitis C Screening  Never done  . COVID-19 Vaccine (1) Never done  . HIV Screening  Never done    There are no preventive care reminders to display for this patient.  No results found for: TSH Lab Results  Component Value Date   WBC 10.6 (H) 11/29/2015   HGB 14.8 11/29/2015   HCT 44.5 11/29/2015   MCV 85.1 11/29/2015    PLT 191 11/29/2015   Lab Results  Component Value Date   NA 135 11/29/2015   K 3.6 11/29/2015   CO2 27 11/29/2015   GLUCOSE 105 (H) 11/29/2015   BUN 10 11/29/2015   CREATININE 1.12 11/29/2015   BILITOT 1.0 11/29/2015   ALKPHOS 71 11/29/2015  AST 17 11/29/2015   ALT 30 11/29/2015   PROT 7.8 11/29/2015   ALBUMIN 4.6 11/29/2015   CALCIUM 9.4 11/29/2015   ANIONGAP 9 11/29/2015   No results found for: CHOL No results found for: HDL No results found for: LDLCALC No results found for: TRIG No results found for: CHOLHDL No results found for: IWPY0D    Assessment & Plan:   Problem List Items Addressed This Visit    None    Visit Diagnoses    Mass of left testicle    -  Primary   Relevant Orders   US Scrotum    Patient describes 3 to 93-month history of intermittent pain left testicle region with recently noted area of firmness along the inferior portion of the testicle -Set up ultrasound of scrotum to further assess  No orders of the defined types were placed in this encounter.   Follow-up: No follow-ups on file.    Evelena Peat, MD

## 2020-01-22 NOTE — Patient Instructions (Signed)
Testicular Self-Exam A self-examination of your testicles (testicular self-exam) involves looking at and feeling your testicles for abnormal lumps or swelling. Several things can cause swelling, lumps, or pain in your testicles. Some of these causes are:  Injuries.  Inflammation.  Infection.  Buildup of fluids around your testicle (hydrocele).  Twisted testicles (testicular torsion).  Testicular cancer. Why is it important to do a testicular self-exam? Self-examination of the testicles and the left and right groin areas may be recommended if you are at risk for testicular cancer. Your groin is where your lower abdomen meets your upper thighs. You may be at risk for testicular cancer if you have:  An undescended testicle (cryptorchidism).  A history of previous testicular cancer.  A family history of testicular cancer. How to do a testicular self-exam The testicles are easiest to examine after a warm bath or shower. They are more difficult to examine when you are cold. This is because the muscles attached to the testicles retract and pull them up higher or into the abdomen. A normal testicle is egg-shaped and feels firm. It is smooth and not tender. The spermatic cord can be felt as a firm, spaghetti-like cord at the back of your testicle. Look and feel for changes  Stand and hold your penis away from your body.  Look at each testicle to check for lumps or swelling.  Roll each testicle between your thumb and forefinger, feeling the entire testicle. Feel for: ? Lumps. ? Swelling. ? Discomfort.  Check the groin area between your abdomen and upper thighs on both sides of your body. Look and feel for any swelling or bumps that are tender. These could be enlarged lymph nodes. Contact a health care provider if:  You find any bumps or lumps, such as a small, hard, pea-sized lump.  You find swelling, pain, or soreness.  You see or feel any other changes in your testicles. Summary   A self-examination of your testicles (testicular self-exam) involves looking at and feeling your testicles for any changes.  Self-examination of the testicles and the left and right groin areas may be recommended if you are at risk for testicular cancer.  You should check each of your testicles for lumps, swelling, or discomfort.  You should check for swelling or tender bumps in your groin area between your lower abdomen and upper thighs. This information is not intended to replace advice given to you by your health care provider. Make sure you discuss any questions you have with your health care provider. Document Revised: 10/30/2018 Document Reviewed: 06/04/2016 Elsevier Patient Education  2020 [employer] Inc[employer]  

## 2020-02-04 ENCOUNTER — Ambulatory Visit
Admission: RE | Admit: 2020-02-04 | Discharge: 2020-02-04 | Disposition: A | Discharge status: Home / Self Care | Payer: 59 | Source: Ambulatory Visit | Attending: Family Medicine | Admitting: Family Medicine

## 2020-02-04 DIAGNOSIS — N5089 Other specified disorders of the male genital organs: Secondary | ICD-10-CM

## 2020-09-13 ENCOUNTER — Encounter: Payer: Self-pay | Admitting: Family Medicine

## 2020-09-13 ENCOUNTER — Other Ambulatory Visit: Payer: Self-pay

## 2020-09-13 ENCOUNTER — Ambulatory Visit: Payer: 59 | Admitting: Family Medicine

## 2020-09-13 VITALS — BP 120/78 | HR 80 | Temp 98.5°F | Ht 71.0 in | Wt 282.6 lb

## 2020-09-13 DIAGNOSIS — M791 Myalgia, unspecified site: Secondary | ICD-10-CM | POA: Diagnosis not present

## 2020-09-13 MED ORDER — CYCLOBENZAPRINE HCL 10 MG PO TABS: 10.0000 mg | ORAL_TABLET | Freq: Every day | ORAL | 0 refills | Status: DC

## 2020-09-13 NOTE — Patient Instructions (Signed)
Continue with moist heat and muscle massage  If no better in two weeks let me know and would consider physical therapy.

## 2020-09-13 NOTE — Progress Notes (Signed)
Established Patient Office Visit  Subjective:  Patient ID: James Mckenzie, male    DOB: 1996/06/18  Age: 25 y.o. MRN: [id number]  CC:  Chief Complaint  Patient presents with  . Neck Pain    Patient complains of tightness noted in shoulders and neck x1 year, no known injury.Patient tried going to chiropractor and massages with no relief.     HPI  James Mckenzie presents for increased muscle tension and pain neck and upper back area for really about a year and a half now.  Particularly worse over the past month.  He has not done any recent weight lifting.  He has recently started back some aerobic exercise mostly with elliptical.  He has gone to chiropractor and is not seeing improvement.  He also had muscle massage without improvement.  His symptoms are bilateral.  Denies any cervical neck pain.  His muscle tension and soreness is mostly trapezius muscles bilaterally.  Denies any radiculitis symptoms.  No upper extremity numbness or weakness.  He has tried occasional nonsteroidals and Tylenol with minimal improvement.  Generally sleeping okay.  His work does require a lot of time on the computer.  He tries to get up and stretch frequently.  He has adjusted height of his laptop to create less upper body tension.  Past Medical History:  Diagnosis Date  . Allergy   . Fainting spell 10/2010  . Migraine   . Tonsil and adenoid disease, chronic     Past Surgical History:  Procedure Laterality Date  . DENTAL SURGERY    . TONSILLECTOMY      Family History  Problem Relation Age of Onset  . Hypertension Mother   . Hypertension Father   . Arthritis Maternal Grandmother   . Cancer Maternal Grandmother        breast  . Hyperlipidemia Maternal Grandmother   . Hypertension Maternal Grandmother   . Hyperlipidemia Maternal Grandfather   . Hypertension Maternal Grandfather   . Diabetes Maternal Grandfather   . Hyperlipidemia Paternal Grandmother   . Hypertension Paternal Grandmother   .  Cancer Paternal Grandfather        prostate  . Hyperlipidemia Paternal Grandfather   . Hypertension Paternal Grandfather     Social History   Socioeconomic History  . Marital status: Single    Spouse name: Not on file  . Number of children: Not on file  . Years of education: Not on file  . Highest education level: Not on file  Occupational History  . Not on file  Tobacco Use  . Smoking status: Current Some Day Smoker    Types: Cigars  . Smokeless tobacco: Never Used  Vaping Use  . Vaping Use: Never used  Substance and Sexual Activity  . Alcohol use: Yes    Alcohol/week: 2.0 standard drinks    Types: 2 Glasses of wine per week    Comment: Daily  . Drug use: Never  . Sexual activity: Yes    Birth control/protection: I.U.D.  Other Topics Concern  . Not on file  Social History Narrative  . Not on file   Social Determinants of Health   Financial Resource Strain: Not on file  Food Insecurity: Not on file  Transportation Needs: Not on file  Physical Activity: Not on file  Stress: Not on file  Social Connections: Not on file  Intimate Partner Violence: Not on file    Outpatient Medications Prior to Visit  Medication Sig Dispense Refill  . acetaminophen (TYLENOL) 500 MG  tablet Take 1,000 mg by mouth every 6 (six) hours as needed for moderate pain.    Marland Kitchen acyclovir (ZOVIRAX) 400 MG tablet     . EPINEPHrine 0.3 mg/0.3 mL IJ SOAJ injection Inject 0.3 mg into the muscle as needed for anaphylaxis.    . fluticasone (FLONASE) 50 MCG/ACT nasal spray APP 1 OR 2 SPRAYS IEN BID    . omeprazole (PRILOSEC) 40 MG capsule Take by mouth as needed.    Marland Kitchen escitalopram (LEXAPRO) 10 MG tablet Take 1 tablet (10 mg total) by mouth daily. 30 tablet 11  . ibuprofen (ADVIL,MOTRIN) 200 MG tablet Take 800 mg by mouth every 6 (six) hours as needed for moderate pain.    Marland Kitchen oxymetazoline (AFRIN) 0.05 % nasal spray Place 1 spray into both nostrils daily as needed for congestion.     No  facility-administered medications prior to visit.    Allergies  Allergen Reactions  . Peanut-Containing Drug Products Hives    ROS Review of Systems  Cardiovascular: Negative for chest pain.  Musculoskeletal: Positive for back pain. Negative for arthralgias, myalgias, neck pain and neck stiffness.  Neurological: Negative for weakness and numbness.      Objective:    Physical Exam Vitals reviewed.  Constitutional:      Appearance: Normal appearance.  Cardiovascular:     Rate and Rhythm: Normal rate and regular rhythm.  Pulmonary:     Effort: Pulmonary effort is normal.     Breath sounds: Normal breath sounds.  Musculoskeletal:     Cervical back: Neck supple.     Comments: Palpable muscle tension trapezius muscles bilaterally.  Neurological:     Mental Status: He is alert.     BP 120/78 (BP Location: Left Arm, Patient Position: Sitting, Cuff Size: Large)   Pulse 80   Temp 98.5 F (36.9 C) (Oral)   Ht 5\' 11"  (1.803 m)   Wt 282 lb 9.6 oz (128.2 kg)   SpO2 98%   BMI 39.41 kg/m  Wt Readings from Last 3 Encounters:  09/13/20 282 lb 9.6 oz (128.2 kg)  01/22/20 268 lb 8 oz (121.8 kg)  07/03/19 274 lb 8 oz (124.5 kg)     Health Maintenance Due  Topic Date Due  . COVID-19 Vaccine (3 - Booster for Moderna series) 06/11/2020    There are no preventive care reminders to display for this patient.  No results found for: TSH Lab Results  Component Value Date   WBC 10.6 (H) 11/29/2015   HGB 14.8 11/29/2015   HCT 44.5 11/29/2015   MCV 85.1 11/29/2015   PLT 191 11/29/2015   Lab Results  Component Value Date   NA 135 11/29/2015   K 3.6 11/29/2015   CO2 27 11/29/2015   GLUCOSE 105 (H) 11/29/2015   BUN 10 11/29/2015   CREATININE 1.12 11/29/2015   BILITOT 1.0 11/29/2015   ALKPHOS 71 11/29/2015   AST 17 11/29/2015   ALT 30 11/29/2015   PROT 7.8 11/29/2015   ALBUMIN 4.6 11/29/2015   CALCIUM 9.4 11/29/2015   ANIONGAP 9 11/29/2015   No results found for:  CHOL No results found for: HDL No results found for: LDLCALC No results found for: TRIG No results found for: CHOLHDL No results found for: 01/29/2016    Assessment & Plan:   Problem List Items Addressed This Visit   None   Visit Diagnoses    Muscle tension pain    -  Primary     this sounds more muscular.  Recommend moist heat, continued massage with sports cream and trial of muscle relaxer.  He is aware of potential sedation.  Consider trial of PT if not improving over next several days.  Also recommend frequent stretching throughout the day.   Meds ordered this encounter  Medications  . cyclobenzaprine (FLEXERIL) 10 MG tablet    Sig: Take 1 tablet (10 mg total) by mouth at bedtime.    Dispense:  30 tablet    Refill:  0    Follow-up: No follow-ups on file.    Evelena Peat, MD

## 2020-09-26 IMAGING — US US SCROTUM
1 series · 13 of 25 positions shown · non-contrast
Comparison: None.

CLINICAL DATA: Left testicular mass

EXAM:
SCROTAL ULTRASOUND
DOPPLER ULTRASOUND OF THE TESTICLES
TECHNIQUE: Complete ultrasound examination of the testicles, epididymis, and
other scrotal structures was performed. Color and spectral Doppler
ultrasound were also utilized to evaluate blood flow to the
testicles.

[Series 1: us scrotum · 0.05mm/px · 13 of 57 slices shown]
[im 1/57]
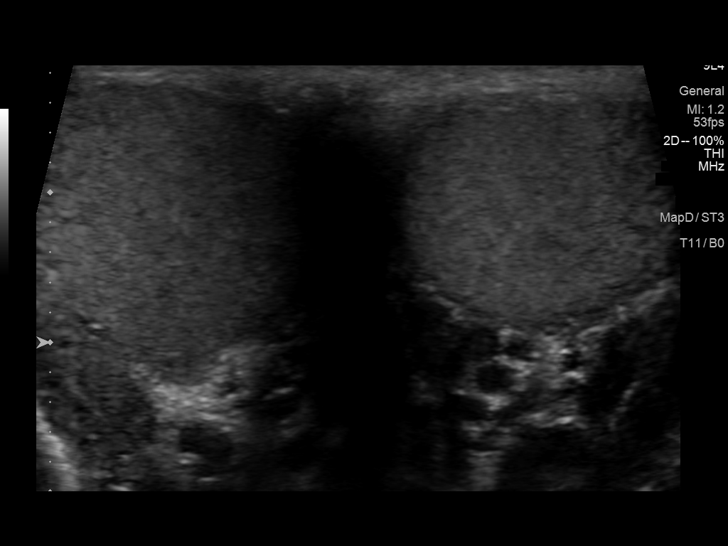
[im 5/57]
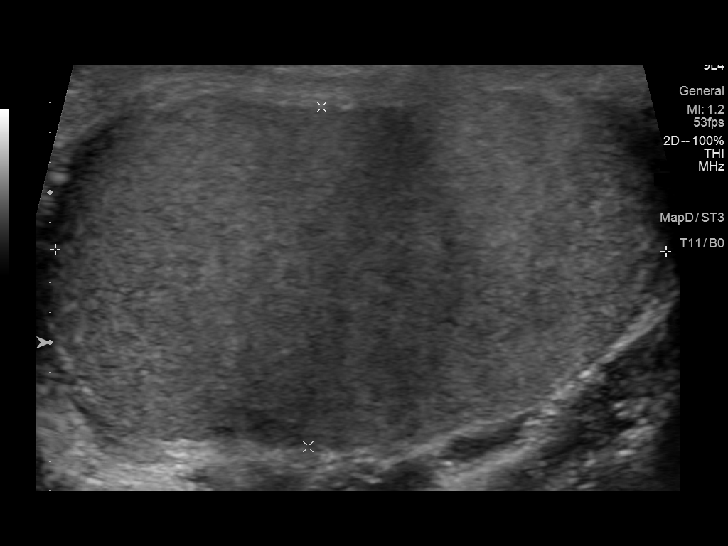
[im 10/57]
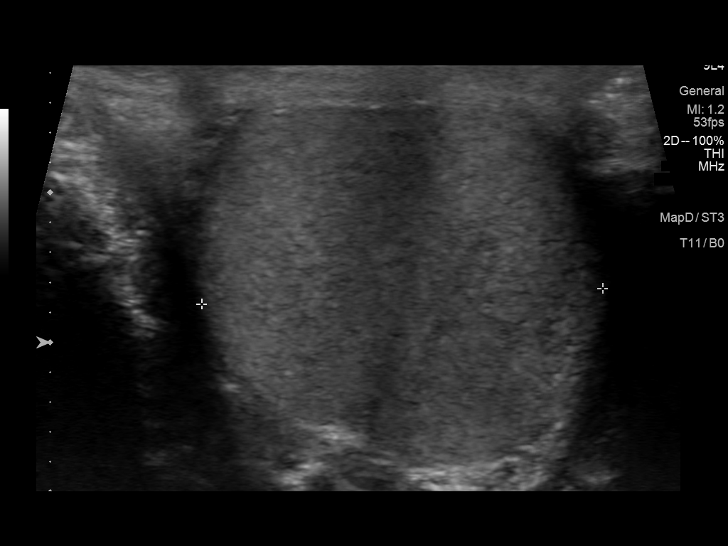
[im 15/57]
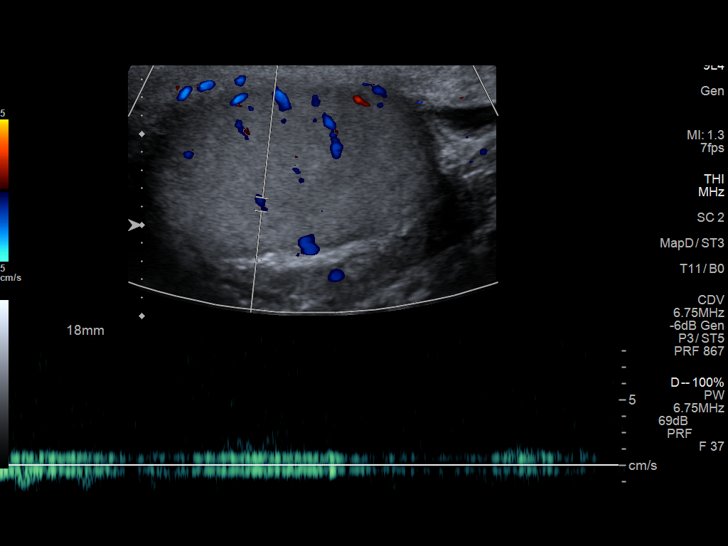
[im 19/57]
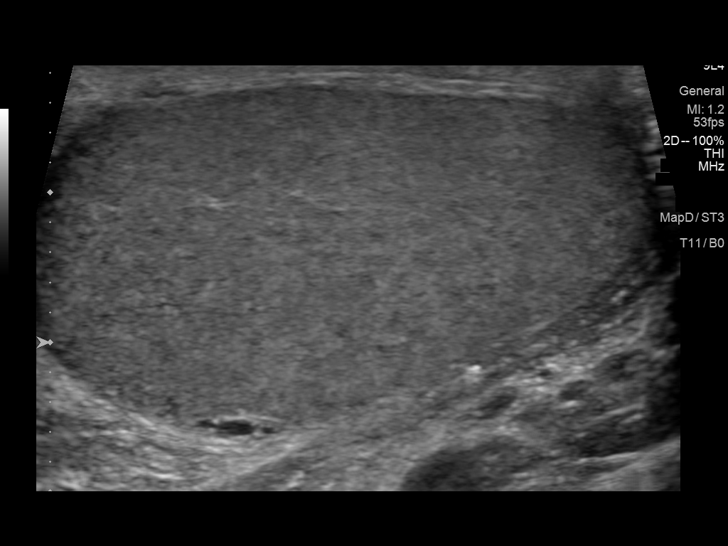
[im 24/57]
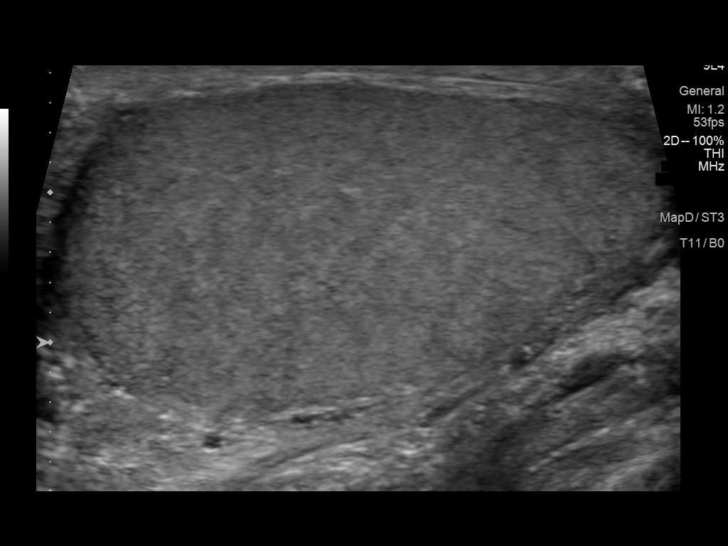
[im 29/57]
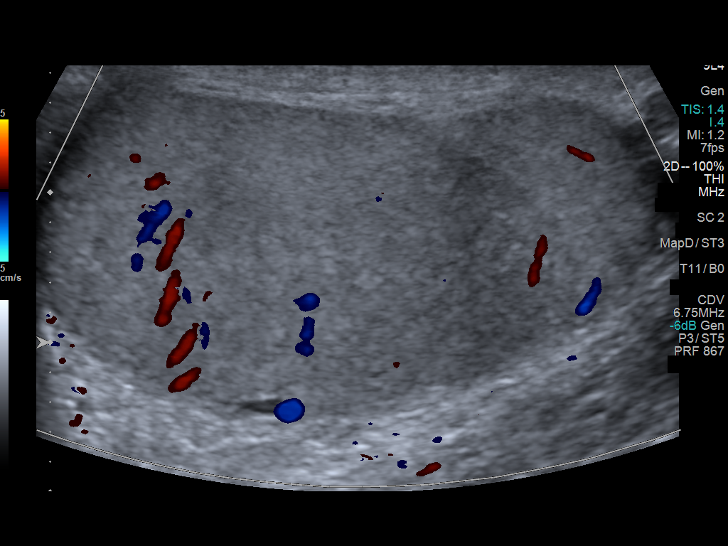
[im 33/57]
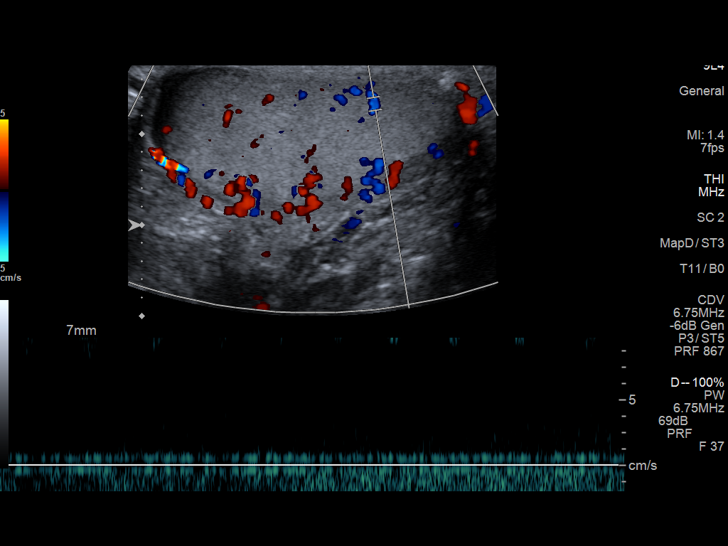
[im 38/57]
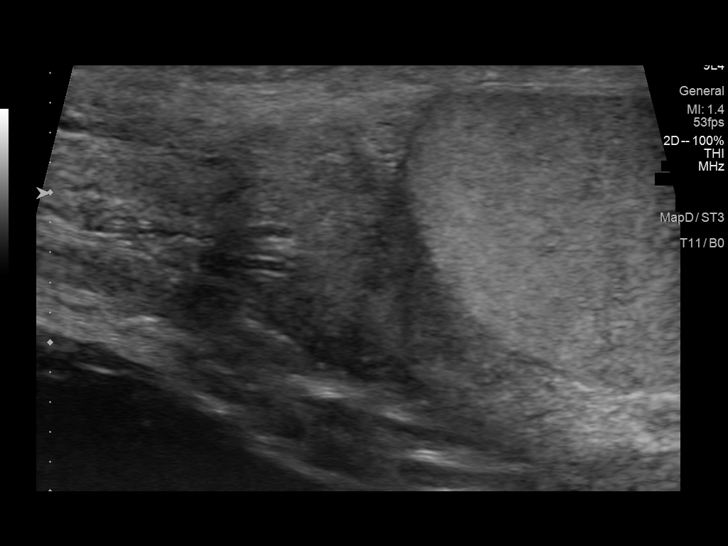
[im 43/57]
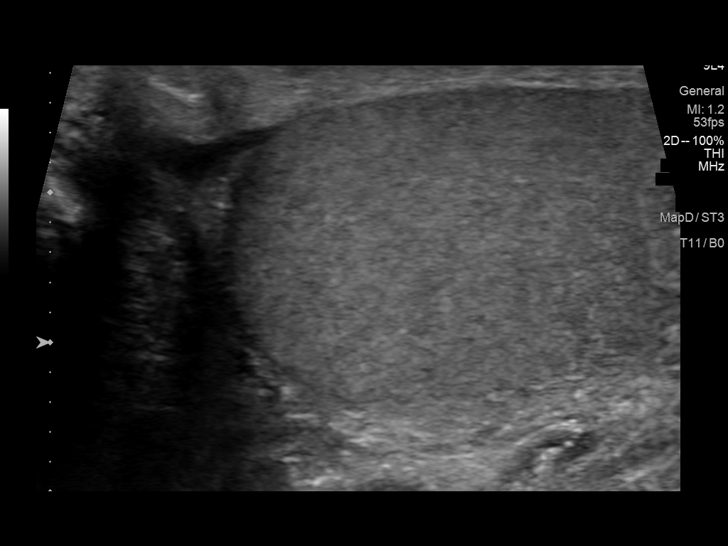
[im 47/57]
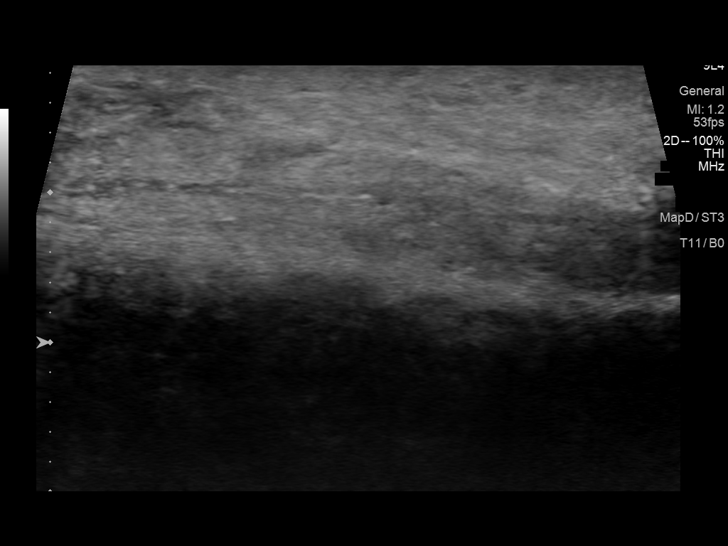
[im 52/57]
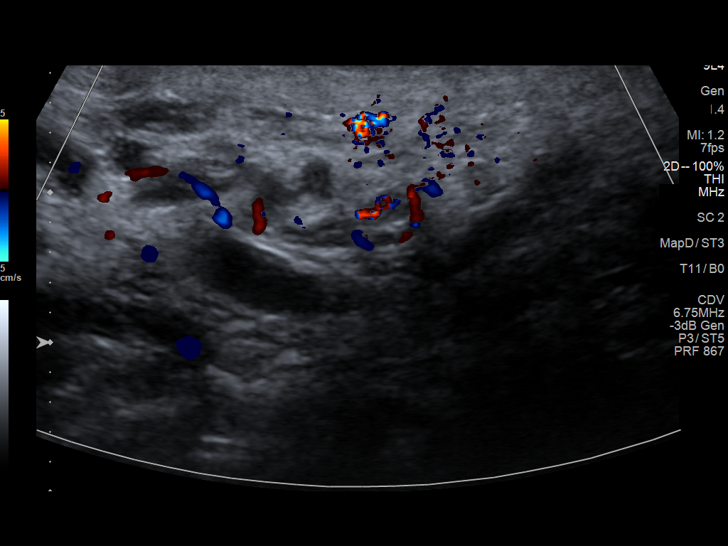
[im 57/57]
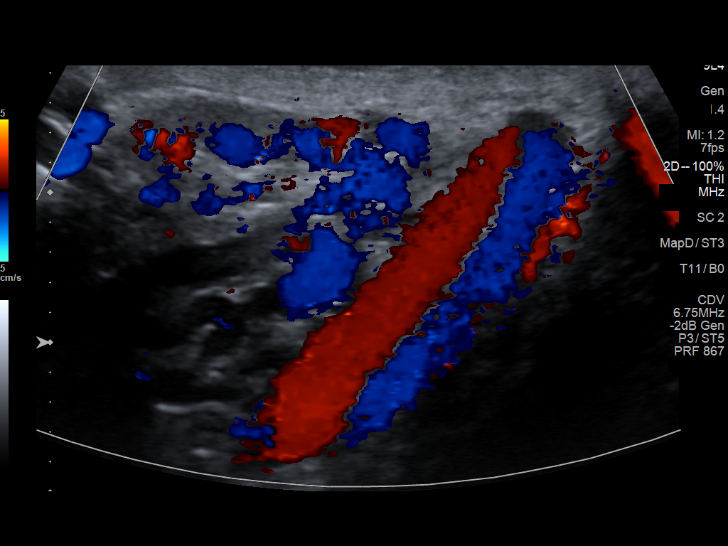

[13 of 25 positions shown; findings below may reference images not displayed]

FINDINGS: Right testicle

Measurements: 4.1 x 2.3 x 2.7 cm. No mass or microlithiasis
visualized.

Left testicle

Measurements: 4.1 x 2.2 x 2.6 cm. No mass or microlithiasis
visualized.

Right epididymis:  Normal in size and appearance.

Left epididymis:  Normal in size and appearance.

Hydrocele:  None visualized.

Varicocele: Dilated patent tortuous venous structures throughout the
left spermatic cord area measuring up to 4 mm in diameter with
Valsalva. Increased color flow and distension with Valsalva
maneuver. Findings compatible with a moderate left varicocele
estimated grade 3. No associated testicular hypotrophy

Pulsed Doppler interrogation of both testes demonstrates normal low
resistance arterial and venous waveforms bilaterally.
IMPRESSION: Moderate left varicocele estimated at grade 3 as above.

Symmetric testicular size without acute testicular finding or
abnormality.

## 2023-08-22 ENCOUNTER — Telehealth: Payer: Self-pay | Admitting: Family Medicine

## 2023-09-14 ENCOUNTER — Emergency Department (HOSPITAL_COMMUNITY): Payer: 59

## 2023-09-14 ENCOUNTER — Other Ambulatory Visit: Payer: Self-pay

## 2023-09-14 ENCOUNTER — Encounter (HOSPITAL_COMMUNITY): Payer: Self-pay | Admitting: *Deleted

## 2023-09-14 ENCOUNTER — Emergency Department (HOSPITAL_COMMUNITY)
Admission: EM | Admit: 2023-09-14 | Discharge: 2023-09-14 | Disposition: A | Discharge status: Home / Self Care | Payer: 59 | Attending: Emergency Medicine | Admitting: Emergency Medicine

## 2023-09-14 DIAGNOSIS — R0789 Other chest pain: Secondary | ICD-10-CM | POA: Insufficient documentation

## 2023-09-14 DIAGNOSIS — Z8679 Personal history of other diseases of the circulatory system: Secondary | ICD-10-CM | POA: Insufficient documentation

## 2023-09-14 DIAGNOSIS — Z91010 Allergy to peanuts: Secondary | ICD-10-CM | POA: Insufficient documentation

## 2023-09-14 DIAGNOSIS — R079 Chest pain, unspecified: Secondary | ICD-10-CM

## 2023-09-14 DIAGNOSIS — Z87898 Personal history of other specified conditions: Secondary | ICD-10-CM

## 2023-09-14 DIAGNOSIS — R0602 Shortness of breath: Secondary | ICD-10-CM | POA: Insufficient documentation

## 2023-09-14 LAB — CBC
HCT: 45.3 % (ref 39.0–52.0)
Hemoglobin: 15.8 g/dL (ref 13.0–17.0)
MCH: 29.3 pg (ref 26.0–34.0)
MCHC: 34.9 g/dL (ref 30.0–36.0)
MCV: 84 fL (ref 80.0–100.0)
Platelets: 248 10*3/uL (ref 150–400)
RBC: 5.39 MIL/uL (ref 4.22–5.81)
RDW: 13.8 % (ref 11.5–15.5)
WBC: 5.2 10*3/uL (ref 4.0–10.5)
nRBC: 0 % (ref 0.0–0.2)

## 2023-09-14 LAB — BASIC METABOLIC PANEL
Anion gap: 13 (ref 5–15)
BUN: 12 mg/dL (ref 6–20)
CO2: 25 mmol/L (ref 22–32)
Calcium: 9.5 mg/dL (ref 8.9–10.3)
Chloride: 97 mmol/L — ABNORMAL LOW (ref 98–111)
Creatinine, Ser: 1 mg/dL (ref 0.61–1.24)
GFR, Estimated: >60 mL/min (ref >60)
Glucose, Bld: 104 mg/dL — ABNORMAL HIGH (ref 70–99)
Potassium: 3.8 mmol/L (ref 3.5–5.1)
Sodium: 135 mmol/L (ref 135–145)

## 2023-09-14 LAB — TROPONIN I (HIGH SENSITIVITY): Troponin I (High Sensitivity): 7 ng/L (ref <18)

## 2023-09-14 MED ORDER — ACETAMINOPHEN 500 MG PO TABS
1000.0000 mg | ORAL_TABLET | Freq: Once | ORAL | Status: AC
  Administered 2023-09-14: 1000 mg via ORAL
  Filled 2023-09-14: qty 2

## 2023-09-14 MED ORDER — ALUM & MAG HYDROXIDE-SIMETH 200-200-20 MG/5ML PO SUSP
30.0000 mL | Freq: Once | ORAL | Status: AC
Start: 2023-09-14 — End: 2023-09-14
  Administered 2023-09-14: 30 mL via ORAL
  Filled 2023-09-14: qty 30

## 2023-09-14 MED ORDER — FAMOTIDINE 20 MG PO TABS
20.0000 mg | ORAL_TABLET | Freq: Once | ORAL | Status: AC
  Administered 2023-09-14: 20 mg via ORAL
  Filled 2023-09-14: qty 1

## 2023-09-14 MED ORDER — PANTOPRAZOLE SODIUM 20 MG PO TBEC: 20.0000 mg | DELAYED_RELEASE_TABLET | Freq: Every day | ORAL | 0 refills | Status: DC

## 2023-09-14 NOTE — ED Provider Notes (Cosign Needed Addendum)
 [place] [facility]MENT AT [facility] Provider Note   CSN: [id number] Arrival date & time: 09/14/23  7829     History  Chief Complaint  Patient presents with   Chest Pain   HPI James Mckenzie is a 28 y.o. male with a history of syncope and migraines presenting for chest pain.  Started 3 days ago.  States the pain is mostly central at times it radiates to the left chest and also down into the upper abdomen.  He states that "burping" makes it better.  Endorses some intermittent shortness of breath.  States its constant and kind of a dull pain.  It is not worse with deep breathing or exertion per patient.  Denies cough or fever.  Denies nausea vomiting diarrhea.  Denies heavy alcohol use.  Also mention that he "may have passed out" about 10 days ago.  States he had a headache at that time and started seeing gray spots in both eyes and then "passed out".  States this has happened to him before but it has been a year.   Chest Pain      Home Medications Prior to Admission medications   Medication Sig Start Date End Date Taking? Authorizing Provider  pantoprazole (PROTONIX) 20 MG tablet Take 1 tablet (20 mg total) by mouth daily for 15 days. 09/14/23 09/29/23 Yes Gareth Eagle, PA-C  acetaminophen (TYLENOL) 500 MG tablet Take 1,000 mg by mouth every 6 (six) hours as needed for moderate pain.    [provider]  acyclovir (ZOVIRAX) 400 MG tablet  12/30/18   [provider]  cyclobenzaprine (FLEXERIL) 10 MG tablet Take 1 tablet (10 mg total) by mouth at bedtime. 09/13/20   Burchette, Elberta Fortis, MD  EPINEPHrine 0.3 mg/0.3 mL IJ SOAJ injection Inject 0.3 mg into the muscle as needed for anaphylaxis.    [provider]  fluticasone (FLONASE) 50 MCG/ACT nasal spray APP 1 OR 2 SPRAYS IEN BID 12/10/18   [provider]  omeprazole (PRILOSEC) 40 MG capsule Take by mouth as needed. 05/22/16   [provider]      Allergies     Peanut-containing drug products    Review of Systems   Review of Systems  Cardiovascular:  Positive for chest pain.    Physical Exam Updated Vital Signs BP 127/79 (BP Location: Right Arm)   Pulse 74   Temp 98.4 F (36.9 C) (Oral)   Resp 14   Ht 5\' 11"  (1.803 m)   Wt 122.5 kg   SpO2 100%   BMI 37.66 kg/m  Physical Exam Vitals and nursing note reviewed.  HENT:     Head: Normocephalic and atraumatic.     Mouth/Throat:     Mouth: Mucous membranes are moist.  Eyes:     General:        Right eye: No discharge.        Left eye: No discharge.     Conjunctiva/sclera: Conjunctivae normal.  Cardiovascular:     Rate and Rhythm: Normal rate and regular rhythm.     Pulses: Normal pulses.     Heart sounds: Normal heart sounds.  Pulmonary:     Effort: Pulmonary effort is normal.     Breath sounds: Normal breath sounds.  Abdominal:     General: Abdomen is flat.     Palpations: Abdomen is soft.  Skin:    General: Skin is warm and dry.  Neurological:     General: No focal deficit present.  Psychiatric:        Mood and Affect: Mood normal.     ED Results / Procedures / Treatments   Labs (all labs ordered are listed, but only abnormal results are displayed) Labs Reviewed  BASIC METABOLIC PANEL - Abnormal; Notable for the following components:      Result Value   Chloride 97 (*)    Glucose, Bld 104 (*)    All other components within normal limits  CBC  TROPONIN I (HIGH SENSITIVITY)    EKG EKG Interpretation Date/Time:  Saturday September 14 2023 04:40:54 EST Ventricular Rate:  77 PR Interval:  150 QRS Duration:  82 QT Interval:  350 QTC Calculation: 396 R Axis:   74  Text Interpretation: Normal sinus rhythm with sinus arrhythmia Normal ECG No previous ECGs available Confirmed by Gwyneth Sprout ([id number]) on 09/14/2023 8:06:59 AM  Radiology DG Chest 2 View Result Date: 09/14/2023 CLINICAL DATA:  Chest pain EXAM: CHEST - 2 VIEW COMPARISON:  11/29/2015 FINDINGS: The  lungs are clear without focal pneumonia, edema, pneumothorax or pleural effusion. The cardiopericardial silhouette is within normal limits for size. No acute bony abnormality. IMPRESSION: No active cardiopulmonary disease. Electronically Signed   By: Kennith Center M.D.   On: 09/14/2023 05:24    Procedures Procedures    Medications Ordered in ED Medications  acetaminophen (TYLENOL) tablet 1,000 mg (1,000 mg Oral Given 09/14/23 0818)  famotidine (PEPCID) tablet 20 mg (20 mg Oral Given 09/14/23 0818)  alum & mag hydroxide-simeth (MAALOX/MYLANTA) 200-200-20 MG/5ML suspension 30 mL (30 mLs Oral Given 09/14/23 0818)    ED Course/ Medical Decision Making/ A&P Clinical Course as of 09/14/23 0858  Sat Sep 14, 2023  0806 Creatinine: 1.00 [JR]    Clinical Course User Index [JR] Gareth Eagle, PA-C                                 Medical Decision Making Amount and/or Complexity of Data Reviewed Labs: ordered. Decision-making details documented in ED Course. Radiology: ordered.  Risk OTC drugs. Prescription drug management.   Initial Impression and Ddx 28 year old well-appearing male present for chest pain.  Exam was unremarkable.  DDx includes ACS, PE, pneumothorax, aortic dissection, acute pancreatitis, reflux, arrhythmia. Patient PMH that increases complexity of ED encounter:  h/o syncope  Interpretation of Diagnostics I independent reviewed and interpreted the labs as followed: No acute derangements, mild hyperglycemia  - I independently visualized the following imaging with scope of interpretation limited to determining acute life threatening conditions related to emergency care: CXR, which revealed no acute findings  -I personally reviewed and interpreted the EKG which revealed  Patient Reassessment and Ultimate Disposition/Management On reassessment after GI cocktail and Tylenol, patient stated that his chest pain had improved.  Per chart review, patient has been evaluated  before for his syncope in childhood and in adulthood in the past.  However given his chest pain and recent occurrence of syncope felt it warranted further evaluation.  Submitted ambulatory referral to cardiology.  Also starting him on omeprazole as this could be reflux.  Discussed strict return precautions.  Discharged in good condition.  Also considered PE but unlikely given he is PERC negative and chest pain is nonpleuritic and improved after GI cocktail.  Patient management required discussion with the following services or consulting groups:  None  Complexity of Problems Addressed Acute complicated illness or Injury  Additional Data Reviewed and Analyzed Further history obtained  from: Further history from spouse/family member, Past medical history and medications listed in the EMR, and Prior ED visit notes  Patient Encounter Risk Assessment Prescriptions      Final Clinical Impression(s) / ED Diagnoses Final diagnoses:  Chest pain, unspecified type  History of syncope    Rx / DC Orders ED Discharge Orders          Ordered    pantoprazole (PROTONIX) 20 MG tablet  Daily        09/14/23 0858    Ambulatory referral to Cardiology       Comments: If you have not heard from the Cardiology office within the next 72 hours please call [phone].   09/14/23 0858              Gareth Eagle, PA-C 09/14/23 6213    Gwyneth Sprout, MD 09/15/23 [phone]

## 2023-09-14 NOTE — Discharge Instructions (Addendum)
 Evaluation today.  Chest pain was overall reassuring.  Nevertheless recommend that you follow-up with cardiology for your chest pain and passing out.  If you have chest pain again, have shortness of breath, passout or any other concerning symptom please return emergency department further evaluation.  Otherwise I sent Protonix to your pharmacy.  Recommend that you try it for 2 weeks to see if symptoms improve.  Recommend you follow-up with your PCP as well as the chest pain could be more related to reflux.

## 2023-09-14 NOTE — ED Triage Notes (Signed)
  C/o anterior chest pain onset 3 days ago states he passed out at work last week. , states the pain is constant however he has had some tingling in his left arm.

## 2023-12-06 NOTE — Progress Notes (Addendum)
 Subjective Patient ID: James Mckenzie is a 28 y.o. male.  Pt presents for routine STD testing today. He denies having penile discharge, pain, rashes or bleeding. Also denies urinary symptoms. Pt is requesting HIV and syphilis testing as well. Denies previous infections.   Endorses more than one sexual partner but does not use condoms every time.     History provided by:  Patient Language interpreter used: No     Review of Systems  Constitutional:  Negative for chills, fatigue and fever.  HENT:  Negative for sore throat and trouble swallowing.   Respiratory:  Negative for cough.   Gastrointestinal:  Negative for abdominal pain, diarrhea, nausea and vomiting.  Genitourinary:  Negative for difficulty urinating, dysuria, flank pain, frequency, genital sores, hematuria, penile discharge, penile swelling and testicular pain.  Musculoskeletal:  Negative for arthralgias and myalgias.  Skin:  Negative for rash.  Neurological:  Negative for headaches.  Hematological:  Negative for adenopathy.  All other systems reviewed and are negative.   Patient History  Allergies: Allergies  Allergen Reactions  . Peanut-Containing Drug Products Hives     Past Medical History:  Diagnosis Date  . Herpes    History reviewed. No pertinent surgical history. Social History   Socioeconomic History  . Marital status: Married    Spouse name: Not on file  . Number of children: Not on file  . Years of education: Not on file  . Highest education level: Not on file  Occupational History  . Not on file  Tobacco Use  . Smoking status: Some Days    Types: Cigars  . Smokeless tobacco: Never  Substance and Sexual Activity  . Alcohol use: Yes    Comment: occassionally  . Drug use: Never  . Sexual activity: Yes  Other Topics Concern  . Not on file  Social History Narrative  . Not on file   Family History  Problem Relation Name Age of Onset  . Hypertension Mother    . Hypertension Father      Current Outpatient Medications on File Prior to Visit  Medication Sig Dispense Refill  . acyclovir (Zovirax) 400 MG tablet     . EPINEPHrine (Epipen) 0.3 MG/0.3ML injection syringe Inject 0.3 mg into the shoulder, thigh, or buttocks.    SABRA acyclovir (Zovirax) 800 MG tablet Take 1 tablet (800 mg total) by mouth 2 (two) times a day. (Patient not taking: Reported on 05/16/2022) 60 tablet 0   No current facility-administered medications on file prior to visit.     Objective  There were no vitals filed for this visit.               No results found.  Physical Exam Vitals and nursing note reviewed.  Constitutional:      Appearance: Normal appearance. He is normal weight.  Cardiovascular:     Rate and Rhythm: Normal rate.  Pulmonary:     Effort: Pulmonary effort is normal.  Abdominal:     General: Abdomen is flat. Bowel sounds are normal.     Palpations: Abdomen is soft.  Genitourinary:    Comments: Deferred - no active symptoms Skin:    General: Skin is warm and dry.  Neurological:     Mental Status: He is alert and oriented to person, place, and time.      Results for orders placed or performed in visit on 12/06/23  Ct, Ng, Trich vag by NAA  Component Result   Chlamydia by NAA Negative   Gonococcus  by NAA Negative   Trich vag by NAA Negative   Narrative   Performed at:  [address] [place], [place]  [id number] Lab Director: Frankey Sas MD, Phone:  [phone]  HIV-1 and HIV-2 antibodies  Component Result   HIV Screen 4th Generation wRfx Non Reactive    Comment: HIV-1/HIV-2 antibodies and HIV-1 p24 antigen were NOT detected. There is no laboratory evidence of HIV infection. HIV Negative    Narrative   Performed at:  [address], [place], [place]  [id number] Lab Director: Frankey Sas MD, Phone:  [phone]  RPR, Rfx Qn RPR/Confirm TP  Component Result   RPR Non Reactive   Narrative   Performed  at:  [address] [facility] [address], [place], [place]  [id number] Lab Director: Frankey Sas MD, Phone:  [phone]  HSV 1/2 PCR  Component Result   HSV-1 DNA Negative   HSV-2 DNA Negative    Comment: This test was developed and its performance characteristics determined by [employer]. It has not been cleared or approved by the U.S. Food and Drug Administration. The FDA has determined that such clearance or approval is not necessary. This test is used for clinical purposes. It should not be regarded as investigational or research.    Narrative   Performed at:  [address] [facility] [address] [place], [place]  [id number] Lab Director: Frankey Sas MD, Phone:  [phone]  POCT urinalysis dipstick manually resulted  Component Result   Color, UA Orange (A)   Clarity, UA Clear   Glucose, UA Negative   Bilirubin, UA Negative   Ketones, UA Negative   Spec Grav, UA >=1.030 (A)   Blood, UA Negative   pH, UA 6.0   Protein, UA 1+ (A)   Urobilinogen, UA Negative   Leukocytes, UA Negative   Nitrite, UA Negative     Procedures MDM:     1 Acute, uncomplicated illness or injury     Explanation of Medical Decision Making and variances from expected care:  Testing ordered and sent to lab. Pt is asymptomatic and stable. No genital exam was necessary.   There was shared decision making with the patient. They agreed with the plan/treatment. Strict ER precautions were given to the pt and they understood.     Unique ordered tests: Three+     Review of any test results: One     Assessment requiring historian other than patient: No     Independent visualization of image, tracing, or test: No     Discussion of management with another provider: No     Risk:: Moderate          Assessment/Plan Diagnoses and all orders for this visit:  Screening for STDs (sexually transmitted diseases) -     POCT urinalysis dipstick manually resulted -     Ct, Ng, Trich vag by  NAA -     HIV-1 and HIV-2 antibodies -     RPR, Rfx Qn RPR/Confirm TP -     HSV 1/2 PCR      Disposition Status: Home  Progress note signed by Rosaline Albino, PA on 12/10/23 at  6:34 AM

## 2023-12-06 NOTE — Progress Notes (Addendum)
 Patient present today for STD testing. Pt states he just wants routine check-up and has not had any symptoms .

## 2023-12-11 NOTE — Telephone Encounter (Signed)
 Patient called to receive UA results

## 2023-12-12 NOTE — Telephone Encounter (Signed)
 Pt contacted with lab results.

## 2024-03-08 ENCOUNTER — Ambulatory Visit (INDEPENDENT_AMBULATORY_CARE_PROVIDER_SITE_OTHER)
Admission: EM | Admit: 2024-03-08 | Discharge: 2024-03-08 | Disposition: A | Discharge status: Other Acute Inpatient Hospital | Source: Home / Self Care

## 2024-03-08 ENCOUNTER — Encounter (HOSPITAL_COMMUNITY): Payer: Self-pay

## 2024-03-08 ENCOUNTER — Encounter (HOSPITAL_COMMUNITY): Payer: Self-pay | Admitting: Family Medicine

## 2024-03-08 ENCOUNTER — Other Ambulatory Visit: Payer: Self-pay

## 2024-03-08 ENCOUNTER — Inpatient Hospital Stay (HOSPITAL_COMMUNITY)
Admission: AD | Admit: 2024-03-08 | Discharge: 2024-03-09 | DRG: 880 | Disposition: A | Discharge status: Home / Self Care | Source: Intra-hospital | Attending: Student in an Organized Health Care Education/Training Program | Admitting: Family Medicine

## 2024-03-08 DIAGNOSIS — Z8249 Family history of ischemic heart disease and other diseases of the circulatory system: Secondary | ICD-10-CM | POA: Diagnosis not present

## 2024-03-08 DIAGNOSIS — Z833 Family history of diabetes mellitus: Secondary | ICD-10-CM | POA: Diagnosis not present

## 2024-03-08 DIAGNOSIS — G43909 Migraine, unspecified, not intractable, without status migrainosus: Secondary | ICD-10-CM | POA: Diagnosis present

## 2024-03-08 DIAGNOSIS — R45851 Suicidal ideations: Principal | ICD-10-CM | POA: Diagnosis present

## 2024-03-08 DIAGNOSIS — F4325 Adjustment disorder with mixed disturbance of emotions and conduct: Principal | ICD-10-CM

## 2024-03-08 DIAGNOSIS — F32A Depression, unspecified: Secondary | ICD-10-CM | POA: Diagnosis present

## 2024-03-08 DIAGNOSIS — Z8261 Family history of arthritis: Secondary | ICD-10-CM | POA: Diagnosis not present

## 2024-03-08 DIAGNOSIS — Z83438 Family history of other disorder of lipoprotein metabolism and other lipidemia: Secondary | ICD-10-CM | POA: Diagnosis not present

## 2024-03-08 DIAGNOSIS — R4587 Impulsiveness: Secondary | ICD-10-CM | POA: Diagnosis present

## 2024-03-08 LAB — CBC WITH DIFFERENTIAL/PLATELET
Abs Immature Granulocytes: 0.02 K/uL (ref 0.00–0.07)
Basophils Absolute: 0 K/uL (ref 0.0–0.1)
Basophils Relative: 1 %
Eosinophils Absolute: 0 K/uL (ref 0.0–0.5)
Eosinophils Relative: 0 %
HCT: 48.2 % (ref 39.0–52.0)
Hemoglobin: 15.9 g/dL (ref 13.0–17.0)
Immature Granulocytes: 0 %
Lymphocytes Relative: 24 %
Lymphs Abs: 1.7 K/uL (ref 0.7–4.0)
MCH: 28.2 pg (ref 26.0–34.0)
MCHC: 33 g/dL (ref 30.0–36.0)
MCV: 85.5 fL (ref 80.0–100.0)
Monocytes Absolute: 0.5 K/uL (ref 0.1–1.0)
Monocytes Relative: 7 %
Neutro Abs: 5 K/uL (ref 1.7–7.7)
Neutrophils Relative %: 68 %
Platelets: 241 K/uL (ref 150–400)
RBC: 5.64 MIL/uL (ref 4.22–5.81)
RDW: 14.3 % (ref 11.5–15.5)
WBC: 7.4 K/uL (ref 4.0–10.5)
nRBC: 0 % (ref 0.0–0.2)

## 2024-03-08 LAB — COMPREHENSIVE METABOLIC PANEL WITH GFR
ALT: 29 U/L (ref 0–44)
AST: 17 U/L (ref 15–41)
Albumin: 4.6 g/dL (ref 3.5–5.0)
Alkaline Phosphatase: 60 U/L (ref 38–126)
Anion gap: 13 (ref 5–15)
BUN: 8 mg/dL (ref 6–20)
CO2: 27 mmol/L (ref 22–32)
Calcium: 10.1 mg/dL (ref 8.9–10.3)
Chloride: 98 mmol/L (ref 98–111)
Creatinine, Ser: 1.01 mg/dL (ref 0.61–1.24)
GFR, Estimated: >60 mL/min (ref >60)
Glucose, Bld: 107 mg/dL — ABNORMAL HIGH (ref 70–99)
Potassium: 4.2 mmol/L (ref 3.5–5.1)
Sodium: 138 mmol/L (ref 135–145)
Total Bilirubin: 1.2 mg/dL (ref 0.0–1.2)
Total Protein: 7.9 g/dL (ref 6.5–8.1)

## 2024-03-08 LAB — LIPID PANEL
Cholesterol: 270 mg/dL — ABNORMAL HIGH (ref 0–200)
HDL: 58 mg/dL (ref >40)
LDL Cholesterol: 184 mg/dL — ABNORMAL HIGH (ref 0–99)
Total CHOL/HDL Ratio: 4.7 ratio
Triglycerides: 139 mg/dL (ref <150)
VLDL: 28 mg/dL (ref 0–40)

## 2024-03-08 LAB — POCT URINE DRUG SCREEN - MANUAL ENTRY (I-SCREEN)
POC Amphetamine UR: NOT DETECTED
POC Buprenorphine (BUP): NOT DETECTED
POC Cocaine UR: NOT DETECTED
POC Marijuana UR: NOT DETECTED
POC Methadone UR: NOT DETECTED
POC Methamphetamine UR: NOT DETECTED
POC Morphine: NOT DETECTED
POC Oxazepam (BZO): NOT DETECTED
POC Oxycodone UR: NOT DETECTED
POC Secobarbital (BAR): NOT DETECTED

## 2024-03-08 LAB — TSH: TSH: 1.858 u[IU]/mL (ref 0.350–4.500)

## 2024-03-08 LAB — ETHANOL: Alcohol, Ethyl (B): <15 mg/dL (ref <15)

## 2024-03-08 MED ORDER — TRAZODONE HCL 50 MG PO TABS
50.0000 mg | ORAL_TABLET | Freq: Every evening | ORAL | Status: DC | PRN
  Administered 2024-03-08: 50 mg via ORAL
  Filled 2024-03-08: qty 1

## 2024-03-08 MED ORDER — ALUM & MAG HYDROXIDE-SIMETH 200-200-20 MG/5ML PO SUSP: 30.0000 mL | ORAL | Status: DC | PRN

## 2024-03-08 MED ORDER — MAGNESIUM HYDROXIDE 400 MG/5ML PO SUSP: 30.0000 mL | Freq: Every day | ORAL | Status: DC | PRN

## 2024-03-08 MED ORDER — OLANZAPINE 5 MG PO TBDP: 5.0000 mg | ORAL_TABLET | Freq: Three times a day (TID) | ORAL | Status: DC | PRN

## 2024-03-08 MED ORDER — OLANZAPINE 10 MG IM SOLR: 5.0000 mg | Freq: Three times a day (TID) | INTRAMUSCULAR | Status: DC | PRN

## 2024-03-08 MED ORDER — HYDROXYZINE HCL 25 MG PO TABS
25.0000 mg | ORAL_TABLET | Freq: Once | ORAL | Status: AC | PRN
  Administered 2024-03-08: 25 mg via ORAL
  Filled 2024-03-08: qty 1

## 2024-03-08 MED ORDER — ACETAMINOPHEN 325 MG PO TABS: 650.0000 mg | ORAL_TABLET | Freq: Four times a day (QID) | ORAL | Status: DC | PRN

## 2024-03-08 MED ORDER — OLANZAPINE 10 MG IM SOLR: 10.0000 mg | Freq: Three times a day (TID) | INTRAMUSCULAR | Status: DC | PRN

## 2024-03-08 NOTE — ED Provider Notes (Signed)
 FBC/OBS ASAP Discharge Summary  Date and Time: 03/08/2024 11:29 AM  Name: James Mckenzie  MRN:  [id number]   Discharge Diagnoses:  Final diagnoses:  Suicidal ideation   Subjective: I cheated on my wife   Stay Summary: Per triage, [name] is a 28 year old male presenting to [facility] accompanied by [employer]. Pt states, my family wanted me to be seen today because I just want to die. Pt states that he found out yesterday that his wife has been cheating on him. Pt states throughout triage I hate myself and I am a f---up. Pt began to punch himself in triage and was tearful. Pt mentions that he drank alot of beer yesterday but is unsure of how much. Pt reports no hx of substance use. However, pt does report that he is actively suicidal at this time and has a plan to end his life with his mother's gun. Pt reports no past suicide attempts or self harm. Pt states he would try to hurt himself if he were to leave. Pt was very emotional and closed his eyes throughout triage. Pt states he is disappointed in himself and presenting depressed mood observed by this Clinical research associate. Pt has no known mental health diagnoses at this time. Pt also reports that he is not seeing a therapist or taking medication at this time. Pt denies drug use in the past 24 hours, Hi and AVH.    Chart reviewed and discussed with attending psychiatrist, Dr Kandi Hahn.   Mother reported she called the police on the patient after she received a text that the patient was going to harm himself. Mother is a retired Midwife and patient went in [employer] and rambled around and found mother's gun. Mother states patient locked himself in the bathroom. Pt's father took the bathroom door down and found pt with the gun. The patient with active suicidal ideation, expressing thoughts of self-harm without a clear safety plan or adequate coping strategies. The patient is considered to be at high risk for self-injury. Inpatient psychiatric  hospitalization is recommended for safety and stabilization. Pt has been accepted to [facility] for inpatient psychiatric treatment and will be able to transfer over today.   Total Time spent with patient: 15 minutes  Past Psychiatric History: None Hospitalization: None reported    Is the patient at risk to self? Yes  Has the patient been a risk to self in the past 6 months? No .    Has the patient been a risk to self within the distant past? No   Is the patient a risk to others? No   Has the patient been a risk to others in the past 6 months? No   Has the patient been a risk to others within the distant past? No    Past Medical History: None reported    Family History: None reported Family Psych History: Maternal uncle-died from suicide   Social History: Married x 3 years, employed full-time, no children. No [employer]. Substance Use: Denies tobacco, THC, methamphetamine, cocaine, heroin and fentanyl  use   Tobacco Cessation:  N/A, patient does not currently use tobacco products  Current Medications:  Current Facility-Administered Medications  Medication Dose Route Frequency Provider Last Rate Last Admin   acetaminophen  (TYLENOL ) tablet 650 mg  650 mg Oral Q6H PRN Nikita Surman E, NP       alum & mag hydroxide-simeth (MAALOX/MYLANTA) 200-200-20 MG/5ML suspension 30 mL  30 mL Oral Q4H PRN Gracia Saggese E, NP  magnesium  hydroxide (MILK OF MAGNESIA) suspension 30 mL  30 mL Oral Daily PRN Cherine Drumgoole E, NP       OLANZapine  (ZYPREXA ) injection 10 mg  10 mg Intramuscular TID PRN Malana Eberwein E, NP       OLANZapine  (ZYPREXA ) injection 5 mg  5 mg Intramuscular TID PRN Chasin Findling E, NP       OLANZapine  zydis (ZYPREXA ) disintegrating tablet 5 mg  5 mg Oral TID PRN Jasaun Carn E, NP       Current Outpatient Medications  Medication Sig Dispense Refill   acetaminophen  (TYLENOL ) 500 MG tablet Take 500 mg by mouth every 6 (six) hours as needed (pain).     EPINEPHrine 0.3 mg/0.3 mL IJ SOAJ  injection Inject 0.3 mg into the muscle as needed for anaphylaxis.     ibuprofen (ADVIL) 200 MG tablet Take 400 mg by mouth every 6 (six) hours as needed (pain).     SUMAtriptan  (IMITREX ) 100 MG tablet Take 100 mg by mouth every 2 (two) hours as needed for migraine. May repeat in 2 hours if headache persists or recurs. Max 2 tablets in 24 hr period     topiramate (TOPAMAX) 25 MG capsule Take 25 mg by mouth 2 (two) times daily.      PTA Medications:  Facility Ordered Medications  Medication   acetaminophen  (TYLENOL ) tablet 650 mg   alum & mag hydroxide-simeth (MAALOX/MYLANTA) 200-200-20 MG/5ML suspension 30 mL   magnesium  hydroxide (MILK OF MAGNESIA) suspension 30 mL   OLANZapine  zydis (ZYPREXA ) disintegrating tablet 5 mg   OLANZapine  (ZYPREXA ) injection 5 mg   OLANZapine  (ZYPREXA ) injection 10 mg   PTA Medications  Medication Sig   EPINEPHrine 0.3 mg/0.3 mL IJ SOAJ injection Inject 0.3 mg into the muscle as needed for anaphylaxis.   topiramate (TOPAMAX) 25 MG capsule Take 25 mg by mouth 2 (two) times daily.       03/24/2019    8:21 AM  Depression screen PHQ 2/9  Decreased Interest 0  Down, Depressed, Hopeless 0  PHQ - 2 Score 0  Altered sleeping 0  Tired, decreased energy 0  Change in appetite 0  Feeling bad or failure about yourself  0  Trouble concentrating 0  Moving slowly or fidgety/restless 0  Suicidal thoughts 0  PHQ-9 Score 0  Difficult doing work/chores Not difficult at all    Flowsheet Row ED from 03/08/2024 in [facility] ED from 09/14/2023 in [facility] Emergency Department at [facility]  C-SSRS RISK CATEGORY High Risk No Risk    Musculoskeletal  Strength & Muscle Tone: within normal limits Gait & Station: normal Patient leans: N/A  Psychiatric Specialty Exam  Presentation  General Appearance:  Appropriate for Environment; Casual  Eye Contact: Good  Speech: Clear and Coherent; Normal Rate  Speech  Volume: Normal  Handedness: Right   Mood and Affect  Mood: Depressed  Affect: Tearful   Thought Process  Thought Processes: Coherent  Descriptions of Associations:Intact  Orientation:Full (Time, Place and Person)  Thought Content:Logical  Diagnosis of Schizophrenia or Schizoaffective disorder in past: No    Hallucinations:Hallucinations: None  Ideas of Reference:None  Suicidal Thoughts:Suicidal Thoughts: Yes, Active SI Active Intent and/or Plan: With Intent; With Plan  Homicidal Thoughts:Homicidal Thoughts: No   Sensorium  Memory: Recent Good; Immediate Good; Remote Good  Judgment: Poor  Insight: Fair   Chartered certified accountant: Fair  Attention Span: Fair  Recall: Good  Fund of Knowledge: Good  LanguageBETHA Richard  Psychomotor Activity  Psychomotor Activity: Psychomotor Activity: Normal   Assets  Assets: Communication Skills; Desire for Improvement; Financial Resources/Insurance; Housing; Resilience; Social Support   Sleep  Sleep: Sleep: Fair  No Safety Checks orders active in given range  Nutritional Assessment (For OBS and FBC admissions only) Has the patient had a weight loss or gain of 10 pounds or more in the last 3 months?: No Has the patient had a decrease in food intake/or appetite?: No Does the patient have dental problems?: No Does the patient have eating habits or behaviors that may be indicators of an eating disorder including binging or inducing vomiting?: No Has the patient recently lost weight without trying?: 0 Has the patient been eating poorly because of a decreased appetite?: 0 Malnutrition Screening Tool Score: 0    Physical Exam  Presentation General Appearance: Appropriate for Environment; Casual   Eye Contact:Good   Speech:Clear and Coherent; Normal Rate   Speech Volume:Normal   Handedness:Right     Mood and Affect  Mood:Depressed   Affect:Tearful     Thought Process  Thought  Processes:Coherent   Descriptions of Associations:Intact   Orientation:Full (Time, Place and Person)   Thought Content:Logical    Hallucinations:Hallucinations: None   Ideas of Reference:None   Suicidal Thoughts:Suicidal Thoughts: Yes, Active SI Active Intent and/or Plan: With Intent; With Plan   Homicidal Thoughts:Homicidal Thoughts: No     Sensorium  Memory:Recent Good; Immediate Good; Remote Good   Judgment:Poor   Insight:Fair     Executive Functions  Concentration:Fair   Attention Span:Fair   Recall:Good   Fund of Knowledge:Good   Language:Good    Blood pressure (!) 158/84, pulse 89, temperature 98.4 F (36.9 C), temperature source Oral, resp. rate 20, SpO2 99%. There is no height or weight on file to calculate BMI.   Disposition: Admit to inpatient   Patient has been admitted to [facility] and will transfer today  Sherrell Culver, PMHNP-BC, FNP-BC  03/08/2024, 11:29 AM

## 2024-03-08 NOTE — Progress Notes (Signed)
(  Sleep Hours) -6  (Any PRNs that were needed, meds refused, or side effects to meds)- Trazodone  50 mg, Vistaril  25 mg  (Any disturbances and when (visitation, over night)-N/A  (Concerns raised by the patient)- N/A  (SI/HI/AVH)- denies

## 2024-03-08 NOTE — Tx Team (Signed)
 Initial Treatment Plan 03/08/2024 1:43 PM James Mckenzie FMW:[id number]    PATIENT STRESSORS: Marital or family conflict     PATIENT STRENGTHS: Average or above average intelligence  Capable of independent living    PATIENT IDENTIFIED PROBLEMS:   Suicidal thoughts    My wife cheated on me               DISCHARGE CRITERIA:  Adequate post-discharge living arrangements Improved stabilization in mood, thinking, and/or behavior  PRELIMINARY DISCHARGE PLAN: Outpatient therapy  PATIENT/FAMILY INVOLVEMENT: This treatment plan has been presented to and reviewed with the patient, James Mckenzie.The patient hasbeen given the opportunity to ask questions and make suggestions.  Shanda BIRCH Leigh Kaeding, RN 03/08/2024, 1:43 PM

## 2024-03-08 NOTE — ED Notes (Signed)
 The patient is sitting in the recliner, watching television. No distress noted. Environment is secured. Plan of care ongoing, no further concerns as of present. Patient expresses no other needs at this time.

## 2024-03-08 NOTE — Plan of Care (Signed)
   Problem: Education: Goal: Emotional status will improve Outcome: Progressing Goal: Mental status will improve Outcome: Progressing   Problem: Activity: Goal: Sleeping patterns will improve Outcome: Progressing   Problem: Safety: Goal: Periods of time without injury will increase Outcome: Progressing

## 2024-03-08 NOTE — Plan of Care (Signed)
  Problem: Education: Goal: Knowledge of [facility] Education information/materials will improve 03/08/2024 1336 by Charlyn Shanda BIRCH, RN Outcome: Progressing 03/08/2024 1336 by Charlyn Shanda BIRCH, RN Outcome: Progressing Goal: Emotional status will improve Outcome: Progressing Goal: Mental status will improve Outcome: Progressing Goal: Verbalization of understanding the information provided will improve 03/08/2024 1336 by Charlyn Shanda BIRCH, RN Outcome: Progressing 03/08/2024 1336 by Charlyn Shanda BIRCH, RN Outcome: Progressing

## 2024-03-08 NOTE — Progress Notes (Addendum)
 [facility] Admission Note:  Pt is a 28 y/o male voluntarily admitted to [facility] from [facility] on 03/08/24 due to [facility] stating I just want to die. Per [facility]: Pt states that he found out yesterday that his wife has been cheating on him. Pt states throughout triage I hate myself and I am a f---up.  Pt denies SI/HI/AVH during admission. Pt presents with a depressed mood and flat affect. Pt reports that he drinks alcohol 1 day/week and generally has about 2-3 beers. Pt was cooperative throughout admission. Pt's belongings searched and placed in locker #17. Pt's skin assessed with Avelina, MHT, no notable findings present. Pt oriented to the unit, q 15 minute safety checks initiated.    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 Food allergy form filled out accordingly and placed in cafeteria pt allergy book.

## 2024-03-08 NOTE — ED Notes (Addendum)
 Patient arrived to unit voluntarily via [employer]. Patient states his mother called the police when patient voiced to her him wanting to shoot himself with her gun. Patient states he told his wife that he had been cheating on her, and since then she has ceased all communication with him. Patient voices feeling like a failure and ruin[ing] everyone's life.  Patient is A&Ox4. Patient currently denies intent/thoughts to harm self/others when asked. Denies A/VH. Patient denies any physical complaints when asked. No distress noted. Support and encouragement provided. Routine safety checks conducted according to facility protocol. Encouraged patient to notify staff if thoughts of harm toward self or others arise. Endorses safety. Patient verbalized understanding and agreement. Plan of care ongoing, no further concerns as of present. Patient expresses no other needs at this time.

## 2024-03-08 NOTE — Group Note (Signed)
 Date:  03/08/2024 Time:  4:10 PM  Group Topic/Focus: Emotional Diary Emotional Education:   The focus of this group is to discuss what feelings/emotions are, and how they are experienced.    Participation Level:  Active  Participation Quality:  Appropriate  Affect:  Appropriate  Cognitive:  Appropriate  Insight: Appropriate  Engagement in Group:  Engaged  Modes of Intervention:  Discussion  Additional Comments:  Patient engaged in group appropriately.   Rolando Hessling D Salik Grewell 03/08/2024, 4:10 PM

## 2024-03-08 NOTE — BH Assessment (Signed)
 Comprehensive Clinical Assessment (CCA) Note  03/08/2024 James Mckenzie [id number]  DISPOSITION:  Per James Culver, NP, patient is recommended for inpatient.  The patient demonstrates the following risk factors for suicide: Chronic risk factors for suicide include: N/A. Acute risk factors for suicide include: family or marital conflict. Protective factors for this patient include: positive social support and religious beliefs against suicide. Considering these factors, the overall suicide risk at this point appears to be high. Patient is not appropriate for outpatient follow up.  Per triage note: James Mckenzie is a 28 year old male presenting to [facility] accompanied by [employer]. Pt states, my family wanted me to be seen today because I just want to die. Pt states that he found out yesterday that his wife has been cheating on him. Pt states throughout triage I hate myself and I am a f--k up. Pt began to punch himself in triage and was tearful. Pt mentions that he drank alot of beer yesterday but is unsure of how much. Pt reports no hx of substance use. However, pt does report that he is actively suicidal at this time and has a plan to end his life with his mother's gun. Pt reports no past suicide attempts or self harm. Pt states he would try to hurt himself if he were to leave. Pt was very emotional and closed his eyes throughout triage. Pt states he is disappointed in himself and presenting depressed mood observed by this Clinical research associate. Pt has no known mental health diagnoses at this time. Pt also reports that he is not seeing a therapist or taking medication at this time. Pt denies drug use in the past 24 hours, Hi and AVH  Patient is a 28 year old male who presents to [facility] voluntarily  accompanied by [employer].   Patient states that his family wanted him to be evaluated due to him wanting to die after confessing to his wife that he had been cheating on her.  Patient reports that the other woman had reached out to his wife  and he decided to go ahead and confess so that his wife would hear it from him and not someone else.  Patient says that he is disappointed in himself and feel that he has let everyone down.  His wife is not currently communicating with him and that has him feeling down.  Patient states  he feels  there is no coming back and so he does not want to live.    Patient does not have any psychiatric diagnosis.  He denies HI and AVH.  Although he reports having an unknown amount of alcohol on, he reports that he normally does not drink alcohol. He denies illicit substance use.  MSE: Patient is casually dressed, alert, and oriented x5.  Speech is clear and coherent, with normal volume and tone. Patient is calm yet tearful. Patient's mood is depressed with congruent affect.  The patient's thought process is coherent and relevant. There is no indication that the patient is currently responding to internal stimuli or experiencing delusional thought content. Patient was cooperative throughout assessment.    Chief Complaint:  Chief Complaint  Patient presents with   Suicidal Ideation    Visit Diagnosis:  Suicidal ideation   CCA Screening, Triage and Referral (STR)  Patient Reported Information How did you hear about us ? Self  What Is the Reason for Your Visit/Call Today? James Mckenzie is a 28 year old male presenting to [facility] accompanied by [employer]. Pt states, my family wanted me to be  seen today because I just want to die. Pt states that he found out yesterday that his wife has been cheating on him. Pt states throughout triage I hate myself and I am a f--k up. Pt began to punch himself in triage and was tearful. Pt mentions that he drank alot of beer yesterday but is unsure of how much. Pt reports no hx of substance use. However, pt does report that he is actively suicidal at this time and has a plan to end his life with his mother's gun. Pt reports no past suicide attempts or self harm. Pt states he would try  to hurt himself if he were to leave. Pt was very emotional and closed his eyes throughout triage. Pt states he is disappointed in himself and presenting depressed mood observed by this Clinical research associate. Pt has no known mental health diagnoses at this time. Pt also reports that he is not seeing a therapist or taking medication at this time. Pt denies drug use in the past 24 hours, Hi and AVH. How Long Has This Been Causing You Problems? <Week  What Do You Feel Would Help You the Most Today? Medication(s); Treatment for Depression or other mood problem   Have You Recently Had Any Thoughts About Hurting Yourself? Yes  Are You Planning to Commit Suicide/Harm Yourself At This time? Yes   Flowsheet Row ED from 09/14/2023 in [place] [facility]  C-SSRS RISK CATEGORY No Risk    Have you Recently Had Thoughts About Hurting Someone [name]? No  Are You Planning to Harm Someone at This Time? No  Explanation: N/A   Have You Used Any Alcohol or Drugs in the Past 24 Hours? Yes  How Long Ago Did You Use Drugs or Alcohol? Last night What Did You Use and How Much? Patient is unsure of amount but   a lot  since he normally does not consume alocohol.   Do You Currently Have a Therapist/Psychiatrist? No  Name of Therapist/Psychiatrist:    Have You Been Recently Discharged From Any Office Practice or Programs? No  Explanation of Discharge From Practice/Program: N/A   CCA Screening Triage Referral Assessment Type of Contact: Face-to-Face  Telemedicine Service Delivery:   Is this Initial or Reassessment?   Date Telepsych consult ordered in [facility]:    Time Telepsych consult ordered in [facility]:    Location of Assessment: [facility] Assessment Services  Provider Location: [facility] Assessment Services   Collateral Involvement: None   Does Patient Have a Automotive engineer Guardian? -- (N/A)  Legal Guardian Contact Information: N/A  Copy of Legal Guardianship Form: --  (N/A)  Legal Guardian Notified of Arrival: -- (N/A)  Legal Guardian Notified of Pending Discharge: -- (N/A)  If Minor and Not Living with Parent(s), Who has Custody? N/A  Is CPS involved or ever been involved? Never Is APS involved or ever been involved? Never  Patient Determined To Be At Risk for Harm To Self or Others Based on Review of Patient Reported Information or Presenting Complaint? Yes, for Self-Harm  Method: Plan with intent and identified person  Availability of Means: In hand or used  Intent: Clearly intends on inflicting harm that could cause death  Notification Required: No need or identified person  Additional Information for Danger to Others Potential: N/A Additional Comments for Danger to Others Potential: Patient is not a potential danger to others  Are There Guns or Other Weapons in Your Home? No (Pt had planned to  get his mother's gun)  Types of Guns/Weapons: unknown (hand gun)  Are These Weapons Safely Secured?                            -- (unknown)  Who Could Verify You Are Able To Have These Secured: Parents  Do You Have any Outstanding Charges, Pending Court Dates, Parole/Probation? Pt. denies  Contacted To Inform of Risk of Harm To Self or Others: Family/Significant Other:    Does Patient Present under Involuntary Commitment? No    [place] of Residence: Other (Comment) [place])   Patient Currently Receiving the Following Services: Not Receiving Services   Determination of Need: Emergent (2 hours)   Options For Referral: Inpatient Hospitalization; Medication Management     CCA Biopsychosocial Patient Reported Schizophrenia/Schizoaffective Diagnosis in Past: No   Strengths: Smart, open to receiving services   Mental Health Symptoms Depression:  Hopelessness; Sleep (too much or little); Tearfulness; Worthlessness; Difficulty Concentrating   Duration of Depressive symptoms: Duration of Depressive Symptoms: Less than two weeks    Mania:  None; Change in energy/activity   Anxiety:   Restlessness; Sleep; Difficulty concentrating; Worrying   Psychosis:  None   Duration of Psychotic symptoms:    Trauma:  None   Obsessions:  None   Compulsions:  None   Inattention:  None   Hyperactivity/Impulsivity:  None   Oppositional/Defiant Behaviors:  None   Emotional Irregularity:  None   Other Mood/Personality Symptoms:  N/A    Mental Status Exam Appearance and self-care  Stature:  Average   Weight:  Overweight   Clothing:  Casual   Grooming:  Neglected   Cosmetic use:  None   Posture/gait:  Normal   Motor activity:  Not Remarkable   Sensorium  Attention:  Normal   Concentration:  Normal   Orientation:  X5   Recall/memory:  Normal   Affect and Mood  Affect:  Flat   Mood:  Depressed   Relating  Eye contact:  Normal   Facial expression:  Depressed   Attitude toward examiner:  Cooperative   Thought and Language  Speech flow: Clear and Coherent   Thought content:  Appropriate to Mood and Circumstances   Preoccupation:  None   Hallucinations:  None   Organization:  Coherent   [employer] of Knowledge:  Average   Intelligence:  Average   Abstraction:  Normal   Judgement:  Fair   Dance movement psychotherapist:  Adequate   Insight:  Fair   Decision Making:  Normal   Social Functioning  Social Maturity:  Responsible   Social Judgement:  Normal   Stress  Stressors:  Relationship; Family conflict   Coping Ability:  Human resources officer Deficits:  Decision making   Supports:  Family     Religion: Religion/Spirituality Are You A Religious Person?: Yes What is Your Religious Affiliation?: None How Might This Affect Treatment?: N/A  Leisure/Recreation: Leisure / Recreation Do You Have Hobbies?: Yes Leisure and Hobbies: Playing games, watching movies  Exercise/Diet: Exercise/Diet Have You Gained or Lost A Significant Amount of Weight in the Past Six  Months?: No Do You Follow a Special Diet?: No Do You Have Any Trouble Sleeping?: Yes Explanation of Sleeping Difficulties: Last night was the first night pt repors he has been unable to sleep.   CCA Employment/Education Employment/Work Situation: Employment / Work Environmental consultant Job has Been Impacted by Current Illness: No Has Patient ever Been in the  Military?: No  Education: Education Is Patient Currently Attending School?: No Last Grade Completed: 12 Did You Attend College?: Yes What Type of College Degree Do you Have?: Interior and spatial designer of business from [employer] Did You Have An Individualized Education Program (IIEP): No Did You Have Any Difficulty At School?: No   CCA Family/Childhood History Family and Relationship History: Family history Marital status: Married Number of Years Married: 3 What types of issues is patient dealing with in the relationship?: Patient reports that he cheated on his wife and now she will nt talk to him. Additional relationship information: apt's girlfrend Does patient have children?: No  Childhood History:  Childhood History By whom was/is the patient raised?: Both parents Did patient suffer any verbal/emotional/physical/sexual abuse as a child?: No Did patient suffer from severe childhood neglect?: No Has patient ever been sexually abused/assaulted/raped as an adolescent or adult?: No Was the patient ever a victim of a crime or a disaster?: No Witnessed domestic violence?: No Has patient been affected by domestic violence as an adult?: No       CCA Substance Use Alcohol/Drug Use: Alcohol / Drug Use Pain Medications: See MAR Prescriptions: See MAR Over the Counter: See MAR History of alcohol / drug use?: No history of alcohol / drug abuse (Pt. had his first drink on last night to mask his pain) Longest period of sobriety (when/how long): N/A Negative Consequences of Use:  (N/A) Withdrawal Symptoms: None                          ASAM's:  Six Dimensions of Multidimensional Assessment  Dimension 1:  Acute Intoxication and/or Withdrawal Potential:   Dimension 1:  Description of individual's past and current experiences of substance use and withdrawal: N/A  Dimension 2:  Biomedical Conditions and Complications:   Dimension 2:  Description of patient's biomedical conditions and  complications: N/A  Dimension 3:  Emotional, Behavioral, or Cognitive Conditions and Complications:  Dimension 3:  Description of emotional, behavioral, or cognitive conditions and complications: N/A  Dimension 4:  Readiness to Change:  Dimension 4:  Description of Readiness to Change criteria: N/A  Dimension 5:  Relapse, Continued use, or Continued Problem Potential:  Dimension 5:  Relapse, continued use, or continued problem potential critiera description: N/A  Dimension 6:  Recovery/Living Environment:  Dimension 6:  Recovery/Iiving environment criteria description: N/A  ASAM Severity Score:    ASAM Recommended Level of Treatment: ASAM Recommended Level of Treatment:  (N/A)   Substance use Disorder (SUD) Substance Use Disorder (SUD)  Checklist Symptoms of Substance Use:  (N/A)  Recommendations for Services/Supports/Treatments: Recommendations for Services/Supports/Treatments Recommendations For Services/Supports/Treatments:  (N/A)  Disposition Recommendation per psychiatric provider: We recommend inpatient psychiatric hospitalization when medically cleared. Patient is under voluntary admission status at this time; please IVC if attempts to leave hospital.   DSM5 Diagnoses: Patient Active Problem List   Diagnosis Date Noted   Migraine headache 02/06/2012   History of syncope 02/06/2012     Referrals to Alternative Service(s): Referred to Alternative Service(s):   Place:   Date:   Time:    Referred to Alternative Service(s):   Place:   Date:   Time:    Referred to Alternative Service(s):   Place:   Date:   Time:     Referred to Alternative Service(s):   Place:   Date:   Time:     Lianne JINNY Shuck, LCSW

## 2024-03-08 NOTE — ED Provider Notes (Signed)
 [facility] Continuous Assessment Admission H&P  Date: 03/08/24 Patient Name: James Mckenzie MRN: [id number] Chief Complaint: I cheated on my wife   Diagnoses:  Final diagnoses:  Suicidal ideation    HPI: Per triage, James Mckenzie is a 28 year old male presenting to [facility] accompanied by [employer]. Pt states, my family wanted me to be seen today because I just want to die. Pt states that he found out yesterday that his wife has been cheating on him. Pt states throughout triage I hate myself and I am a f---up. Pt began to punch himself in triage and was tearful. Pt mentions that he drank alot of beer yesterday but is unsure of how much. Pt reports no hx of substance use. However, pt does report that he is actively suicidal at this time and has a plan to end his life with his mother's gun. Pt reports no past suicide attempts or self harm. Pt states he would try to hurt himself if he were to leave. Pt was very emotional and closed his eyes throughout triage. Pt states he is disappointed in himself and presenting depressed mood observed by this Clinical research associate. Pt has no known mental health diagnoses at this time. Pt also reports that he is not seeing a therapist or taking medication at this time. Pt denies drug use in the past 24 hours, Hi and AVH.   Chart reviewed and discussed with attending psychiatrist, Dr Kandi Hahn.   James Mckenzie is seen face-to-face on the [facility] treatment area. Patient is alert & oriented x 4 and engages in today's evaluation. Today, pt states I cheated on my wife and she found out. States mistress contacted wife and pt had to disclose the infidelity as well as tell his wife that the mistress had gotten pregnant and had an abortion. States his wife left the home and he went to his parents. States he woke up at 0400 with suicidal ideation after I had a dream of what life would look like if I wasn't here. He states he had a plan to kill myself with my mom's gun. He continues to endorse  suicidal ideation. He is unable to verbally contract for safety. He is tearful and quite upset. He denies homicidal ideation, intent or plan. He denies AVH.   Collateral information obtained from patient's mother [name] [phone]. Mother states she called the police and the patient. States that she received a text that patient was going to harm himself. States patient had gotten her gun and locked himself in the bathroom. Pt's father took the door down and found pt with the gun.   The patient with active suicidal ideation, expressing thoughts of self-harm without a clear safety plan or adequate coping strategies. The patient is considered to be at high risk for self-injury. Inpatient psychiatric hospitalization is recommended for safety and stabilization.   Total Time spent with patient: 20 minutes  Musculoskeletal  Strength & Muscle Tone: within normal limits Gait & Station: normal Patient leans: N/A  Psychiatric Specialty Exam  Presentation General Appearance: Appropriate for Environment; Casual  Eye Contact:Good  Speech:Clear and Coherent; Normal Rate  Speech Volume:Normal  Handedness:Right   Mood and Affect  Mood:Depressed  Affect:Tearful   Thought Process  Thought Processes:Coherent  Descriptions of Associations:Intact  Orientation:Full (Time, Place and Person)  Thought Content:Logical    Hallucinations:Hallucinations: None  Ideas of Reference:None  Suicidal Thoughts:Suicidal Thoughts: Yes, Active SI Active Intent and/or Plan: With Intent; With Plan  Homicidal Thoughts:Homicidal Thoughts: No   Sensorium  Memory:Recent Good; Immediate Good; Remote Good  Judgment:Poor  Insight:Fair   Executive Functions  Concentration:Fair  Attention Span:Fair  Recall:Good  Fund of Knowledge:Good  Language:Good   Psychomotor Activity  Psychomotor Activity:Psychomotor Activity: Normal   Assets  Assets:Communication Skills; Desire for  Improvement; Financial Resources/Insurance; Housing; Resilience; Social Support   Sleep  Sleep:Sleep: Fair Number of Hours of Sleep: 6   Nutritional Assessment (For OBS and [facility]BC admissions only) Has the patient had a weight loss or gain of 10 pounds or more in the last 3 months?: No Has the patient had a decrease in food intake/or appetite?: No Does the patient have dental problems?: No Does the patient have eating habits or behaviors that may be indicators of an eating disorder including binging or inducing vomiting?: No Has the patient recently lost weight without trying?: 0 Has the patient been eating poorly because of a decreased appetite?: 0 Malnutrition Screening Tool Score: 0    Physical Exam Vitals and nursing note reviewed.  HENT:     Head: Normocephalic.     Mouth/Throat:     Mouth: Mucous membranes are moist.  Cardiovascular:     Rate and Rhythm: Normal rate.  Pulmonary:     Effort: Pulmonary effort is normal.  Musculoskeletal:        General: Normal range of motion.  Skin:    General: Skin is warm and dry.  Neurological:     Mental Status: He is alert and oriented to person, place, and time.  Psychiatric:     Comments: See HPI    Review of Systems  Constitutional:  Negative for chills and fever.  HENT:  Negative for congestion.   Respiratory:  Negative for shortness of breath.   Cardiovascular:  Negative for chest pain and palpitations.  Gastrointestinal:  Negative for diarrhea, nausea and vomiting.  Psychiatric/Behavioral:  Positive for depression and suicidal ideas. Negative for hallucinations and substance abuse. The patient is nervous/anxious.     Blood pressure (!) 158/84, pulse 89, temperature 98.4 F (36.9 C), temperature source Oral, resp. rate 20, SpO2 99%. There is no height or weight on file to calculate BMI.  Past Psychiatric History: None Hospitalization: None reported   Is the patient at risk to self? Yes  Has the patient been a risk to  self in the past 6 months? No .    Has the patient been a risk to self within the distant past? No   Is the patient a risk to others? No   Has the patient been a risk to others in the past 6 months? No   Has the patient been a risk to others within the distant past? No   Past Medical History: None reported   Family History: None reported Family Psych History: Maternal uncle-died from suicide  Social History: Married x 3 years, employed full-time, no children. No [employer]. Substance Use: Denies tobacco, THC, methamphetamine,   Last Labs:  Admission on 03/08/2024  Component Date Value Ref Range Status   POC Amphetamine UR 03/08/2024 None Detected  NONE DETECTED (Cut Off Level 1000 ng/mL) Final   POC Secobarbital (BAR) 03/08/2024 None Detected  NONE DETECTED (Cut Off Level 300 ng/mL) Final   POC Buprenorphine (BUP) 03/08/2024 None Detected  NONE DETECTED (Cut Off Level 10 ng/mL) Final   POC Oxazepam (BZO) 03/08/2024 None Detected  NONE DETECTED (Cut Off Level 300 ng/mL) Final   POC Cocaine UR 03/08/2024 None Detected  NONE DETECTED (Cut Off Level 300 ng/mL) Final  POC Methamphetamine UR 03/08/2024 None Detected  NONE DETECTED (Cut Off Level 1000 ng/mL) Final   POC Morphine 03/08/2024 None Detected  NONE DETECTED (Cut Off Level 300 ng/mL) Final   POC Methadone UR 03/08/2024 None Detected  NONE DETECTED (Cut Off Level 300 ng/mL) Final   POC Oxycodone UR 03/08/2024 None Detected  NONE DETECTED (Cut Off Level 100 ng/mL) Final   POC Marijuana UR 03/08/2024 None Detected  NONE DETECTED (Cut Off Level 50 ng/mL) Final  Admission on 09/14/2023, Discharged on 09/14/2023  Component Date Value Ref Range Status   Sodium 09/14/2023 135  135 - 145 mmol/L Final   Potassium 09/14/2023 3.8  3.5 - 5.1 mmol/L Final   Chloride 09/14/2023 97 (L)  98 - 111 mmol/L Final   CO2 09/14/2023 25  22 - 32 mmol/L Final   Glucose, Bld 09/14/2023 104 (H)  70 - 99 mg/dL Final   Glucose reference range applies only to  samples taken after fasting for at least 8 hours.   BUN 09/14/2023 12  6 - 20 mg/dL Final   Creatinine, Ser 09/14/2023 1.00  0.61 - 1.24 mg/dL Final   Calcium 97/77/7974 9.5  8.9 - 10.3 mg/dL Final   GFR, Estimated 09/14/2023 >60  >60 mL/min Final   Comment: (NOTE) Calculated using the CKD-EPI Creatinine Equation (2021)    Anion gap 09/14/2023 13  5 - 15 Final   Performed at [facility], [address] [place], [place] [address]   WBC 09/14/2023 5.2  4.0 - 10.5 K/uL Final   RBC 09/14/2023 5.39  4.22 - 5.81 MIL/uL Final   Hemoglobin 09/14/2023 15.8  13.0 - 17.0 g/dL Final   HCT 97/77/7974 45.3  39.0 - 52.0 % Final   MCV 09/14/2023 84.0  80.0 - 100.0 fL Final   MCH 09/14/2023 29.3  26.0 - 34.0 pg Final   MCHC 09/14/2023 34.9  30.0 - 36.0 g/dL Final   RDW 97/77/7974 13.8  11.5 - 15.5 % Final   Platelets 09/14/2023 248  150 - 400 K/uL Final   nRBC 09/14/2023 0.0  0.0 - 0.2 % Final   Performed at [facility], [address], [place], [place] [address]   Troponin I (High Sensitivity) 09/14/2023 7  <18 ng/L Final   Comment: (NOTE) Elevated high sensitivity troponin I (hsTnI) values and significant  changes across serial measurements may suggest ACS but many other  chronic and acute conditions are known to elevate hsTnI results.  Refer to the Links section for chest pain algorithms and additional  guidance. Performed at [facility], [address], [place], [place] [address]     Allergies: Peanut-containing drug products  Medications:  Facility Ordered Medications  Medication   acetaminophen  (TYLENOL ) tablet 650 mg   alum & mag hydroxide-simeth (MAALOX/MYLANTA) 200-200-20 MG/5ML suspension 30 mL   magnesium  hydroxide (MILK OF MAGNESIA) suspension 30 mL   OLANZapine  zydis (ZYPREXA ) disintegrating tablet 5 mg   OLANZapine  (ZYPREXA ) injection 5 mg   OLANZapine  (ZYPREXA ) injection 10 mg   PTA Medications  Medication Sig   acyclovir (ZOVIRAX) 400 MG tablet     fluticasone (FLONASE) 50 MCG/ACT nasal spray APP 1 OR 2 SPRAYS IEN BID   EPINEPHrine 0.3 mg/0.3 mL IJ SOAJ injection Inject 0.3 mg into the muscle as needed for anaphylaxis.   pantoprazole  (PROTONIX ) 20 MG tablet Take 1 tablet (20 mg total) by mouth daily for 15 days.      Medical Decision Making  Lab  Orders         CBC with Differential/Platelet         Comprehensive metabolic panel         Ethanol         Lipid panel         TSH         POCT Urine Drug Screen - (I-Screen)     EKG- QTc 399    Recommendations  Based on my evaluation the patient does not appear to have an emergency medical condition.  Admit to inpatient   Sherrell Culver, PMHNP-BC, FNP-BC  03/08/24  10:36 AM

## 2024-03-08 NOTE — Discharge Instructions (Addendum)
 Transfer to [facility]

## 2024-03-08 NOTE — Progress Notes (Incomplete)
   03/08/24 0841  [facility] Triage Screening (Walk-ins at [facility] only)  How Did You Hear About Us ? Self  What Is the Reason for Your Visit/Call Today? [name] is a 28 year old male presenting to [facility] accompanied by [employer]. Pt states, my family wanted me to be seen today because I just want to die. Pt states that he found out yesterday that his wife has been cheating on him. Pt states throughout triage I hate myself and I am a f*** up. Pt began to punch himself in triage and was tearful. Pt mentions that he drank alot of beer yesterday but is unsure of how much. Pt reports no hx of substance use. However, pt does report that he is actively suicidal at this time and has a plan to end his life with his mother's gun. Pt reports no past suicide attempts or self harm. Pt states he would try to hurt himself if he were to leave. Pt was very emotional and closed his eyes throughout triage. Pt states he is disappointed in himself and presenting depressed mood observed by this Clinical research associate. Pt has no known mental health diagnoses at this time. Pt also reports that he is not seeing a therapist or taking medication at this time. Pt denies drug use in the past 24 hours, Hi and AVH.  How Long Has This Been Causing You Problems? <Week  Have You Recently Had Any Thoughts About Hurting Yourself? Yes  How long ago did you have thoughts about hurting yourself? today  Are You Planning to Commit Suicide/Harm Yourself At This time? Yes  Have you Recently Had Thoughts About Hurting Someone [name]? No  Are You Planning To Harm Someone At This Time? No  Possible abuse reported to: Other (Comment)  Are you currently experiencing any auditory, visual or other hallucinations? No  Have You Used Any Alcohol or Drugs in the Past 24 Hours? Yes  What Did You Use and How Much? a lot of beer unsure of amount  Do you have any current medical co-morbidities that require immediate attention? No  Clinician description of patient physical  appearance/behavior: depressed mood, cooperative, speech and eye contact is normal, appearance is disheveled  What Do You Feel Would Help You the Most Today? Medication(s);Treatment for Depression or other mood problem  If access to [facility] was not available, would you have sought care in the Emergency Department? No  Determination of Need Emergent (2 hours)  Options For Referral Inpatient Hospitalization;Intensive Outpatient Therapy;Medication Management  Determination of Need filed? Yes

## 2024-03-08 NOTE — [facility] Group Notes (Signed)
 [facility] Group Notes:  (Nursing/MHT/Case Management/Adjunct)  Date:  03/08/2024  Time:  2000  Type of Therapy:  Wrap up group  Participation Level:  Active  Participation Quality:  Appropriate, Attentive, and Sharing  Affect:  Appropriate  Cognitive:  Alert  Insight:  Improving  Engagement in Group:  Developing/Improving  Modes of Intervention:  Clarification, Education, and Support  Summary of Progress/Problems: Positive thinking and positive change were discussed.   Lenora Manuelita RAMAN 03/08/2024, 9:13 PM

## 2024-03-09 ENCOUNTER — Encounter (HOSPITAL_COMMUNITY): Payer: Self-pay

## 2024-03-09 DIAGNOSIS — R45851 Suicidal ideations: Secondary | ICD-10-CM | POA: Diagnosis not present

## 2024-03-09 DIAGNOSIS — F4325 Adjustment disorder with mixed disturbance of emotions and conduct: Principal | ICD-10-CM

## 2024-03-09 MED ORDER — HYDROXYZINE HCL 25 MG PO TABS: 25.0000 mg | ORAL_TABLET | Freq: Three times a day (TID) | ORAL | Status: DC | PRN

## 2024-03-09 MED ORDER — HYDROXYZINE HCL 25 MG PO TABS: 25.0000 mg | ORAL_TABLET | Freq: Three times a day (TID) | ORAL | 0 refills | Status: AC | PRN

## 2024-03-09 MED ORDER — TRAZODONE HCL 50 MG PO TABS: 50.0000 mg | ORAL_TABLET | Freq: Every evening | ORAL | 0 refills | Status: AC | PRN

## 2024-03-09 NOTE — BHH Suicide Risk Assessment (Addendum)
 [facility] Discharge Suicide Risk Assessment   Principal Problem: Suicidal ideation Discharge Diagnoses: Principal Problem:   Suicidal ideation  James Mckenzie is a 28 y.o. male with no significant psychiatric history who presented to the behavioral health urgent care center on 8/17 for suicidal ideations with a plan to use a gun. He was transferred to the [facility] symptomatic stabilization and further psychiatric evaluation.   On intake exam, the patient was interviewed in the presence of the attending physician and a medical student.  Patient reports that he was cheating on his wife with another woman prior to this admission.  The other woman ended up getting pregnant, had an abortion and he ended that relationship.  On Saturday, the woman reached out to him and informed him that he was going to tell his wife about their relationship.  As a result the patient disclosed to the wife that he he was unfaithful and had had sex outside of their marriage.   As a result, the wife was very upset and ended up leaving the home.  His parents and her parents eventually came to the house, and later that day he ended up at his mother's house.  During that time he had a dream, I imagine how life would be different if I was no longer here.  Patient reports that he became overwhelmed and really impulsive, and asked when he found his mother's gun and locked himself in the bathroom.   Patient reports that his actions were due to him being impulsive and being overwhelmed by the consequences of his actions.  He suspects that he has lost his wife due to the affair, I have to live with the consequences of my actions, I can't let this moment define me.  Patient says that he has support from his mom who plans to stay with him once discharged, a brother who is traveling as well to stay for 1 week and a friend traveling from [place]  who will be staying with him to monitor and support him for an  additional week. Patient reports that he is a [place] and needs to trust in God during the situation.  Reports that his faith is important to him and he needs to turn his focus and motivation towards that at the current moment.  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  He denies any suicidal ideations currently, prior attempts, self harm behavior, homicidal ideations, auditory or visual hallucinations.   Patient denies any previous evaluations by a psychiatrist or inpatient hospitalizations.  Patient previously saw a therapist to deal with job-related stress, however is not currently.  Patient is interested in getting therapeutic services closer to home in [place], [place] .  Patient deferred trialing any medications at the current moment   Collateral Information, Mom, Mrs. Cameryn Chrisley [phone])  Patient granted consent to discuss hospitalization, gave further collateral and discuss dispo planning without restrictions.   She is a retired Hydrographic surveyor, prior to them picking up her son, they moved the gun in their home to an alternative location. Patient found the gun and locked himself in the bathroom. She was alerted by her cousin that Sidra was in the bathroom with the gun. Husband broke down the bathroom, and he was in the bathroom. The gun was sitting on the bathroom counter, without a bullet in the chamber and the patient was crying inside the bathroom. She reports he  sounds sounds better today and disclosed that he had no intention to commit suicide. She suspects, he did the act  as a cry for help to his wife and denies any prior or recent psychiatric concerns.  She denies any safety concerns with him returning at this time. She will be able to monitor him and plans to stay with him.  She reports no firearms will be on the property, no large stockpiles of pills and no weapons will be available.  Her  husband who is also a former Marine disclosed that he did not have any concerns with Bert being discharged today and would be there to also help support him.   Associated Signs/Symptoms: Depression Symptoms:  suicidal thoughts with specific plan, in context of wife finding out about infidelity (Hypo) Manic Symptoms:  Denies  Anxiety Symptoms:  Denies any significant symptoms, some work related stress, better controlled after therapy   Psychotic Symptoms:   Denies  PTSD Symptoms:  Denies  Total Time spent with patient: 1 hour   Past Psychiatric Hx: Previous Psych Diagnoses: None Prior inpatient treatment: None  Current/prior outpatient treatment: Patient denies, EMR shows Lexapro  10 mg daily  Prior rehab hx: Denies  Psychotherapy hx: Yes, remotely, within the last year  History of suicide: Denies History of homicide or aggression: Denies   Psychiatric medication history: EMR shows Lexapro  10 mg daily  Psychiatric medication compliance history: None  Neuromodulation history: Denies Current Psychiatrist: Denies Current therapist: Yes, remotely, within the last year to manage work related stress    Substance Abuse Hx: Alcohol: Occasional  Tobacco: Cigars x2-3 monthly Illicit drugs: Denies  Rx drug abuse: Denies  Rehab hx: Denies    Past Medical History: Medical Diagnoses: Migraine  Home Rx: Sumatriptan  and Topiramate  Prior Hosp: None reported  Prior Surgeries/Trauma: Tonsillectomy, adenoidectomy at [age] years old  Head trauma, LOC, concussions, seizures:  Denies  Migraine: x2-3 weekly Allergies: NKDA LMP: N/A  PCP: Deferred    Family History: Medical: None reported  Psych: Unaware  Psych Rx: Unaware  SA/HA: Maternal Uncle SA  Substance use family hx: Crack    Social History: Childhood (bring, raised, lives now, parents, siblings, schooling, education): Patient grew up in [place]  with mom, dad and younger brother.  He graduated from [employer] and  attended [employer]. Abuse: Denies  Marital Status: Married  Sexual orientation: Heterosexual  Children: Did not ask  Employment: Warehouse manager Group: support with mom, brother and several friends  Housing: Home, mom plans to stay with him for some time  Legal: No ongoing Paediatric nurse: Denies  Firearms: No firearms in personal residence   Total Time spent with patient: 1 hour  Musculoskeletal: Strength & Muscle Tone: within normal limits Gait & Station: normal Patient leans: N/A  Psychiatric Specialty Exam  Presentation  General Appearance:  Appropriate for Environment; Casual  Eye Contact: Good  Speech: Clear and Coherent; Normal Rate  Speech Volume: Normal  Handedness: Right   Mood and Affect  Mood: Euthymic  Duration of Depression Symptoms: Less than two weeks  Affect: Appropriate; Congruent   Thought Process  Thought Processes: Coherent; Goal Directed; Linear  Descriptions of Associations:Intact  Orientation:Full (Time, Place and Person)  Thought Content:Logical  History of Schizophrenia/Schizoaffective disorder:No  Duration of Psychotic Symptoms:No data recorded Hallucinations:Hallucinations: None  Ideas of Reference:None  Suicidal Thoughts:Suicidal Thoughts: No SI Active Intent and/or Plan: Without Means to Carry Out; Without Intent; Without Plan; Without  Access to Means  Homicidal Thoughts:Homicidal Thoughts: No   Sensorium  Memory: Immediate Good; Recent Good  Judgment: Fair  Insight: Good   Executive Functions  Concentration: Good  Attention Span: Good  Recall: Good  Fund of Knowledge: Good  Language: Good   Psychomotor Activity  Psychomotor Activity: Psychomotor Activity: Normal   Assets  Assets: Communication Skills; Desire for Improvement; Housing; Health and safety inspector; Resilience; Social Support; English as a second language teacher; Vocational/Educational; Physical  Health   Sleep  Sleep: Sleep: Fair  Estimated Sleeping Duration (Last 24 Hours): 4.50-5.75 hours  Physical Exam: Constitutional:      Appearance: Normal appearance.  Eyes:     Conjunctiva/sclera: Conjunctivae normal.  Pulmonary:     Effort: Pulmonary effort is normal.  Musculoskeletal:        General: Normal range of motion.  Neurological:     Mental Status: He is oriented to person, place, and time.     Review of Systems  Constitutional:  Negative for chills, diaphoresis and fever.  Respiratory:  Negative for cough.   Gastrointestinal:  Negative for nausea and vomiting.  Neurological:  Negative for headaches.  Psychiatric/Behavioral:  Negative for depression, hallucinations, substance abuse and suicidal ideas. The patient is nervous/anxious. The patient does not have insomnia.   Blood pressure (!) 140/84, pulse 83, temperature 98.3 F (36.8 C), temperature source Oral, resp. rate 16, height 5' 9 (1.753 m), weight 122.5 kg, SpO2 100%. Body mass index is 39.87 kg/m.  Mental Status Per Nursing Assessment::   On Admission:  Suicidal ideation indicated by patient  Demographic Factors:  Male  Loss Factors: Loss of significant relationship  Historical Factors: Impulsivity  Risk Reduction Factors:   Sense of responsibility to family, Religious beliefs about death, Employed, Positive social support, Positive therapeutic relationship, and Positive coping skills or problem solving skills  Continued Clinical Symptoms:  Patient denying depressive symptoms, anxiety related to work, denies SI/HI/AVH, reports he felt overwhelmed and actions prior to admission were impulsive   Cognitive Features That Contribute To Risk:  None    Suicide Risk:  Mild: Suicidal ideation of limited frequency, intensity, duration, and specificity.  There are no identifiable plans, no associated intent, mild dysphoria and related symptoms, good self-control (both objective and subjective  assessment), few other risk factors, and identifiable protective factors, including available and accessible social support.   Follow-up Information     [facility] Follow up on 03/17/2024.   Why: You have an appt for outpatient therapy on 03/17/2024 at 10:00 am, this is an in person appt. Contact information: [address],  [place], [place] [address] Phone: [phone] FAX: [phone]        [facility] Follow up on 03/13/2024.   Why: Please call this provider on 03/13/24 at 9:00 am to schedule an appointment for medication management services. Contact information: [address]  [address] [place] [address] [phone]                Plan Of Care/Follow-up recommendations:   Activity: as tolerated  Diet: heart healthy  Other: -Follow-up with your outpatient psychiatric provider -instructions on appointment date, time, and address (location) are provided to you in discharge paperwork.  -Take your psychiatric medications as prescribed at discharge - instructions are provided to you in the discharge paperwork  -Follow-up with outpatient primary care doctor and other specialists -for management of preventative medicine and chronic medical disease None   -Testing: Follow-up with outpatient provider for abnormal lab results:  Cholesterol 270 and  LDL cholesterol 184, follow-up with primary provider   -If you are prescribed an atypical antipsychotic medication, we recommend that your outpatient psychiatrist follow routine screening for side effects within 3 months of discharge, including monitoring: AIMS scale, height, weight, blood pressure, fasting lipid panel, HbA1c, and fasting blood sugar.   -Recommend total abstinence from alcohol, tobacco, and other illicit drug use at discharge.   -If your psychiatric symptoms recur, worsen, or if you have side effects to your psychiatric medications, call your outpatient psychiatric provider, 911,  [phone] or go to the nearest emergency department.  -If suicidal thoughts occur, immediately call your outpatient psychiatric provider, 911, [phone] or go to the nearest emergency department.  PATTI OLDEN, MD 03/09/2024, 2:07 PM

## 2024-03-09 NOTE — Progress Notes (Signed)
 Patient discharged off unit at 1555. Patient belongings reviewed and acknowledged by patient. AVS and Transition Record reviewed and acknowledged by patient. Safety plan completed by patient and copy provided. Patient denies SI/HI/AVH. No observed or reported side effects to medication. No observed or reported agitation, aggression, or other acute emotional distress. No observed or reported physical abnormalities or concerns. Patient transportation from facility verified and observed.

## 2024-03-09 NOTE — BHH Counselor (Signed)
 Adult Comprehensive Assessment  Patient ID: James Mckenzie, male   DOB: 1996-05-21, 28 y.o.   MRN: [id number]  Information Source: Information source: Patient (PSA completed with pt)  Current Stressors:  Patient states their primary concerns and needs for treatment are::  I really messed up and I am paying the price, I cheated on my wife and got someone pregnant Patient states their goals for this hospitilization and ongoing recovery are:: ... I need to get back to my roots and get back to church Educational / Learning stressors: none reported Employment / Job issues:  yes, managing stress ha been hard for me Family Relationships:  yes, my wife, has left me Financial / Lack of resources (include bankruptcy): none reported Housing / Lack of housing: none reported Physical health (include injuries & life threatening diseases): none reported Social relationships: none reported Substance abuse: none reported Bereavement / Loss: none reported  Living/Environment/Situation:  Living Arrangements: Spouse/significant other Living conditions (as described by patient or guardian):  I was living with my wife Who else lives in the home?: : my wife How long has patient lived in current situation?: 3 yrs What is atmosphere in current home: Comfortable, Paramedic, Supportive  Family History:  Marital status: Married Number of Years Married: 3 What types of issues is patient dealing with in the relationship?: Patient reports that he cheated on his wife and now she will not talk to him. Additional relationship information: none reported Are you sexually active?: Yes What is your sexual orientation?: Straight Has your sexual activity been affected by drugs, alcohol, medication, or emotional stress?: none reported Does patient have children?: No  Childhood History:  By whom was/is the patient raised?: Both parents Additional childhood history information: none reported Description of  patient's relationship with caregiver when they were a child:  good relationship very loving and supportive Patient's description of current relationship with people who raised him/her:  it is still good How were you disciplined when you got in trouble as a child/adolescent?:  I was Does patient have siblings?: Yes Number of Siblings: 1 Description of patient's current relationship with siblings:  we have a good relationship, our family is very close Did patient suffer any verbal/emotional/physical/sexual abuse as a child?: No Did patient suffer from severe childhood neglect?: No Has patient ever been sexually abused/assaulted/raped as an adolescent or adult?: No Was the patient ever a victim of a crime or a disaster?: No Witnessed domestic violence?: No Has patient been affected by domestic violence as an adult?: No  Education:  Highest grade of school patient has completed: Chief Operating Officer in Business Currently a Consulting civil engineer?: No Learning disability?: No  Employment/Work Situation:   Employment Situation: Employed Where is Patient Currently Employed?: [employer] in [place] How Long has Patient Been Employed?: 4 yrs Are You Satisfied With Your Job?: Yes Do You Work More Than One Job?: No Work Stressors:  dealing with the public Patient's Job has Been Impacted by Current Illness: No What is the Longest Time Patient has Held a Job?: 4 yrs Where was the Patient Employed at that Time?: [employer] Has Patient ever Been in the [employer]?: No  Financial Resources:   Financial resources: Income from employment Does patient have a representative payee or guardian?: No  Alcohol/Substance Abuse:   What has been your use of drugs/alcohol within the last 12 months?:  i can't remeber the last time, I do not drink often If attempted suicide, did drugs/alcohol play a role in this?: No  Alcohol/Substance Abuse Treatment Hx: Denies past history If yes, describe treatment:  none reported Has alcohol/substance abuse ever caused legal problems?: No  Social Support System:   Patient's Community Support System: Good Describe Community Support System:  my family are my support Type of faith/religion: Christianity How does patient's faith help to cope with current illness?:  I pray and read my Bible  Leisure/Recreation:   Do You Have Hobbies?: Yes Leisure and Hobbies: Playing games, watching movies  Strengths/Needs:   What is the patient's perception of their strengths?:  I am wasy to get along with Patient states these barriers may affect/interfere with their treatment: none reported Patient states these barriers may affect their return to the community: none reported Other important information patient would like considered in planning for their treatment: none reported  Discharge Plan:   Currently receiving community mental health services: No Patient states concerns and preferences for aftercare planning are:  therapy  Summary/Recommendations:   Summary and Recommendations (to be completed by the evaluator): James Mckenzie  is a 28 year old male voluntarily admitted to [facility] after presenting to [facility] accompanied by [employer] due to suicidal ideations in the context of wife leaving him. Pt reported that he cheated on his wife and the person that he cheated with called his wife and share information surrounding the affair. Pt reported stressors as family relationships and employment issues. Pt denies SI/HI/AVH. Pt requesting outpatient therapy following discharge. Patient will benefit from crisis stabilization, medication evaluation, group therapy and psychoeducation, in addition to case management for discharge planning. At discharge it is recommended that Patient adhere to the established discharge plan and continue in treatment.  Arbadella Kimbler R. 03/09/2024

## 2024-03-09 NOTE — Discharge Instructions (Signed)

## 2024-03-09 NOTE — Plan of Care (Signed)
 Problem: Education: Goal: Knowledge of [facility] Education information/materials will improve Outcome: Adequate for Discharge Goal: Emotional status will improve Outcome: Adequate for Discharge Goal: Mental status will improve Outcome: Adequate for Discharge Goal: Verbalization of understanding the information provided will improve Outcome: Adequate for Discharge   Problem: Activity: Goal: Interest or engagement in activities will improve Outcome: Adequate for Discharge Goal: Sleeping patterns will improve Outcome: Adequate for Discharge   Problem: Coping: Goal: Ability to verbalize frustrations and anger appropriately will improve Outcome: Adequate for Discharge Goal: Ability to demonstrate self-control will improve Outcome: Adequate for Discharge   Problem: Health Behavior/Discharge Planning: Goal: Identification of resources available to assist in meeting health care needs will improve Outcome: Adequate for Discharge Goal: Compliance with treatment plan for underlying cause of condition will improve Outcome: Adequate for Discharge   Problem: Physical Regulation: Goal: Ability to maintain clinical measurements within normal limits will improve Outcome: Adequate for Discharge   Problem: Safety: Goal: Periods of time without injury will increase Outcome: Adequate for Discharge   Problem: Education: Goal: Knowledge of [facility] Education information/materials will improve Outcome: Adequate for Discharge Goal: Emotional status will improve Outcome: Adequate for Discharge Goal: Mental status will improve Outcome: Adequate for Discharge Goal: Verbalization of understanding the information provided will improve Outcome: Adequate for Discharge   Problem: Activity: Goal: Interest or engagement in activities will improve Outcome: Adequate for Discharge Goal: Sleeping patterns will improve Outcome: Adequate for Discharge   Problem: Coping: Goal:  Ability to verbalize frustrations and anger appropriately will improve Outcome: Adequate for Discharge Goal: Ability to demonstrate self-control will improve Outcome: Adequate for Discharge   Problem: Health Behavior/Discharge Planning: Goal: Identification of resources available to assist in meeting health care needs will improve Outcome: Adequate for Discharge Goal: Compliance with treatment plan for underlying cause of condition will improve Outcome: Adequate for Discharge   Problem: Physical Regulation: Goal: Ability to maintain clinical measurements within normal limits will improve Outcome: Adequate for Discharge   Problem: Safety: Goal: Periods of time without injury will increase Outcome: Adequate for Discharge   Problem: Education: Goal: Ability to state activities that reduce stress will improve Outcome: Adequate for Discharge   Problem: Coping: Goal: Ability to identify and develop effective coping behavior will improve Outcome: Adequate for Discharge   Problem: Self-Concept: Goal: Ability to identify factors that promote anxiety will improve Outcome: Adequate for Discharge Goal: Level of anxiety will decrease Outcome: Adequate for Discharge Goal: Ability to modify response to factors that promote anxiety will improve Outcome: Adequate for Discharge   Problem: Education: Goal: Utilization of techniques to improve thought processes will improve Outcome: Adequate for Discharge Goal: Knowledge of the prescribed therapeutic regimen will improve Outcome: Adequate for Discharge   Problem: Activity: Goal: Interest or engagement in leisure activities will improve Outcome: Adequate for Discharge Goal: Imbalance in normal sleep/wake cycle will improve Outcome: Adequate for Discharge   Problem: Coping: Goal: Coping ability will improve Outcome: Adequate for Discharge Goal: Will verbalize feelings Outcome: Adequate for Discharge   Problem: Health Behavior/Discharge  Planning: Goal: Ability to make decisions will improve Outcome: Adequate for Discharge Goal: Compliance with therapeutic regimen will improve Outcome: Adequate for Discharge   Problem: Role Relationship: Goal: Will demonstrate positive changes in social behaviors and relationships Outcome: Adequate for Discharge   Problem: Safety: Goal: Ability to disclose and discuss suicidal ideas will improve Outcome: Adequate for Discharge Goal: Ability to identify and utilize support systems that promote safety will improve Outcome: Adequate  for Discharge   Problem: Self-Concept: Goal: Will verbalize positive feelings about self Outcome: Adequate for Discharge Goal: Level of anxiety will decrease Outcome: Adequate for Discharge

## 2024-03-09 NOTE — Discharge Summary (Signed)
 Physician Discharge Summary Note  Patient:  James Mckenzie is an 28 y.o., male MRN:  [id number] DOB:  October 20, 1995 Patient phone:  [phone] (home)  Patient address:   [address] [place] [address],    Total Time spent with patient: 1 hour  Date of Admission:  03/08/2024 Date of Discharge: 03/09/2024  Reason for Admission:    James Mckenzie is a 28 y.o. male with no significant psychiatric history who presented to the behavioral health urgent care center on 8/17 for suicidal ideations with a plan to use a gun. He was transferred to the [facility] symptomatic stabilization and further psychiatric evaluation.   On intake exam, the patient was interviewed in the presence of the attending physician and a medical student.  Patient reports that he was cheating on his wife with another woman prior to this admission.  The other woman ended up getting pregnant, had an abortion and he ended that relationship.  On Saturday, the woman reached out to him and informed him that he was going to tell his wife about their relationship.  As a result the patient disclosed to the wife that he he was unfaithful and had had sex outside of their marriage.   As a result, the wife was very upset and ended up leaving the home.  His parents and her parents eventually came to the house, and later that day he ended up at his mother's house.  During that time he had a dream, I imagine how life would be different if I was no longer here.  Patient reports that he became overwhelmed and really impulsive, and asked when he found his mother's gun and locked himself in the bathroom.   Patient reports that his actions were due to him being impulsive and being overwhelmed by the consequences of his actions.  He suspects that he has lost his wife due to the affair, I have to live with the consequences of my actions, I can't let this moment define me.  Patient says that he has support from  his mom who plans to stay with him once discharged, a brother who is traveling as well to stay for 1 week and a friend traveling from [place]  who will be staying with him to monitor and support him for an additional week. Patient reports that he is a [place] and needs to trust in God during the situation.  Reports that his faith is important to him and he needs to turn his focus and motivation towards that at the current moment.  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  He denies any suicidal ideations currently, prior attempts, self harm behavior, homicidal ideations, auditory or visual hallucinations.   Patient denies any previous evaluations by a psychiatrist or inpatient hospitalizations.  Patient previously saw a therapist to deal with job-related stress, however is not currently.  Patient is interested in getting therapeutic services closer to home in [place], [place] .  Patient deferred trialing any medications at the current moment   Collateral Information, Mom, Mrs. Dmarcus Decicco [phone])  Patient granted consent to discuss hospitalization, gave further collateral and discuss dispo planning without restrictions.   She is a retired Hydrographic surveyor, prior to them picking up her son, they moved the gun in their home to an alternative location. Patient found the gun and locked himself in the bathroom. She was alerted by her cousin that  Josh was in the bathroom with the gun. Husband broke down the bathroom, and he was in the bathroom. The gun was sitting on the bathroom counter, without a bullet in the chamber and the patient was crying inside the bathroom. She reports he sounds sounds better today and disclosed that he had no intention to commit suicide. She suspects, he did the act  as a cry for help to his wife and denies any prior or recent psychiatric concerns.  She denies any safety concerns with him  returning at this time. She will be able to monitor him and plans to stay with him.  She reports no firearms will be on the property, no large stockpiles of pills and no weapons will be available.  Her husband who is also a former Marine disclosed that he did not have any concerns with Abiel being discharged today and would be there to also help support him.   Associated Signs/Symptoms: Depression Symptoms:  suicidal thoughts with specific plan, in context of wife finding out about infidelity (Hypo) Manic Symptoms:  Denies  Anxiety Symptoms:  Denies any significant symptoms, some work related stress, better controlled after therapy   Psychotic Symptoms:   Denies  PTSD Symptoms:  Denies  Total Time spent with patient: 1 hour    Substance Abuse Hx: Alcohol: Occasional  Tobacco: Cigars x2-3 monthly Illicit drugs: Denies  Rx drug abuse: Denies  Rehab hx: Denies    Past Medical History: Medical Diagnoses: Migraine  Home Rx: Sumatriptan  and Topiramate  Prior Hosp: None reported  Prior Surgeries/Trauma: Tonsillectomy, adenoidectomy at [age] years old  Head trauma, LOC, concussions, seizures:  Denies  Migraine: x2-3 weekly Allergies: NKDA LMP: N/A  PCP: Deferred    Family History: Medical: None reported  Psych: Unaware  Psych Rx: Unaware  SA/HA: Maternal Uncle SA  Substance use family hx: Crack    Social History: Childhood (bring, raised, lives now, parents, siblings, schooling, education): Patient grew up in [place]   with mom, dad and younger brother.  He graduated from [employer] and attended [employer]. Abuse: Denies  Marital Status: Married  Sexual orientation: Heterosexual  Children: Did not ask  Employment: Warehouse manager Group: support with mom, brother and several friends  Housing: Home, mom plans to stay with him for some time  Legal: No ongoing Paediatric nurse: Denies  Firearms: No firearms in personal  residence    Principal Problem: Suicidal ideation Discharge Diagnoses: Principal Problem:   Suicidal ideation   Past Psychiatric Hx: Previous Psych Diagnoses: None Prior inpatient treatment: None  Current/prior outpatient treatment: Patient denies, EMR shows Lexapro  10 mg daily  Prior rehab hx: Denies  Psychotherapy hx: Yes, remotely, within the last year  History of suicide: Denies History of homicide or aggression: Denies   Psychiatric medication history: EMR shows Lexapro  10 mg daily  Psychiatric medication compliance history: None  Neuromodulation history: Denies Current Psychiatrist: Denies Current therapist: Yes, remotely, within the last year to manage work related stress   Past Medical History:  Past Medical History:  Diagnosis Date   Allergy    Fainting spell 10/2010   Migraine    Tonsil and adenoid disease, chronic     Past Surgical History:  Procedure Laterality Date   DENTAL SURGERY     TONSILLECTOMY     Family History:  Family History  Problem Relation Age of Onset   Hypertension Mother    Hypertension Father    Arthritis Maternal  Grandmother    Cancer Maternal Grandmother        breast   Hyperlipidemia Maternal Grandmother    Hypertension Maternal Grandmother    Hyperlipidemia Maternal Grandfather    Hypertension Maternal Grandfather    Diabetes Maternal Grandfather    Hyperlipidemia Paternal Grandmother    Hypertension Paternal Grandmother    Cancer Paternal Grandfather        prostate   Hyperlipidemia Paternal Grandfather    Hypertension Paternal Grandfather    Family Psychiatric  History:  Psych: Unaware  Psych Rx: Unaware  SA/HA: Maternal Uncle SA  Substance use family hx: Crack    Social History:  Social History   Substance and Sexual Activity  Alcohol Use Yes   Alcohol/week: 2.0 standard drinks of alcohol   Types: 2 Glasses of wine per week   Comment: Daily     Social History   Substance and Sexual Activity  Drug Use Never     Social History   Socioeconomic History   Marital status: Single    Spouse name: Not on file   Number of children: Not on file   Years of education: Not on file   Highest education level: Not on file  Occupational History   Not on file  Tobacco Use   Smoking status: Some Days    Types: Cigars   Smokeless tobacco: Never  Vaping Use   Vaping status: Never Used  Substance and Sexual Activity   Alcohol use: Yes    Alcohol/week: 2.0 standard drinks of alcohol    Types: 2 Glasses of wine per week    Comment: Daily   Drug use: Never   Sexual activity: Yes    Birth control/protection: I.U.D.  Other Topics Concern   Not on file  Social History Narrative   Not on file   Social Drivers of Health   Financial Resource Strain: Not on File (11/22/2020)   Received from [facility]    Financial Resource Strain: 0  Food Insecurity: No Food Insecurity (03/08/2024)   Hunger Vital Sign    Worried About Running Out of Food in the Last Year: Never true    Ran Out of Food in the Last Year: Never true  Transportation Needs: No Transportation Needs (03/08/2024)   PRAPARE - Administrator, Civil Service (Medical): No    Lack of Transportation (Non-Medical): No  Physical Activity: Not on File (11/22/2020)   [facility] from [facility]   Physical Activity    Physical Activity: 0  Stress: Not on File (11/22/2020)   [facility]   Stress    Stress: 0  Social Connections: Not on File (04/05/2023)   Received from [facility]   Social Connections    Connectedness: 0    Hospital Course:    During the patient's hospitalization, patient had extensive initial psychiatric evaluation, and follow-up psychiatric evaluations every day.  Psychiatric diagnoses provided upon initial assessment:  Suicidal Ideations  Patient's psychiatric medications were adjusted on admission:  -- Start hydroxyzine  25 mg three times daily for anxiety as needed  -- Start Trazodone  50 mg at bedtime  as needed for insomnia    During the hospitalization, other adjustments were made to the patient's psychiatric medication regimen: None  Patient's care was discussed during the interdisciplinary team meeting every day during the hospitalization.  The patient denied having side effects to prescribed psychiatric medication.  On intake assessment, the patient is linear and goal directed towards motivations.  He  denies any mood symptoms suggestive of a depressive, bipolar or psychotic disorder.  Patient denies suicidal ideations or homicidal ideations this morning.  Patient reports that incident over the weekend was an impulsive decision and being overwhelmed by his wife finding about his infidelity.  Patient reports that he is now ready to face the consequences of his decision losing my wife and that this 1 moment will not define him. He reports some regret over actions towards wife. Patient seems to have good insight over the situation, wants to restart therapy and also has a good support network in place. He reports that he has good support with mom, brother and friends who plans to come and stay with him over the next several weeks.  Confirmed with mom who denies any safety concerns with the son being discharged today, she suspects that his suicidal gesture was, a cry out for help to his wife.  She was made aware of the plan for discharge and denies any acute concerns regarding his mood. We will defer starting medications at this time per patient request.  Patient also has upcoming mandatory obligation with work tomorrow, if he were to remain inpatient we would also risk the alternative of worsening symptom stabilization of losing his job. He will be discharged on 8/18 to mom with therapy services arranged.    On day of discharge, the patient reports that their mood is stable. The patient denied having suicidal thoughts prior to discharge.  Patient denies having homicidal thoughts.  Patient denies  having auditory hallucinations.  Patient denies any visual hallucinations or other symptoms of psychosis. The patient was motivated to continue taking medication with a goal of continued improvement in mental health.   The patient reports their target psychiatric symptoms of suicidal ideations responded well to adjusting to the milleu. Supportive psychotherapy was provided to the patient. The patient also participated in regular group therapy while hospitalized. Coping skills, problem solving as well as relaxation therapies were also part of the unit programming.  Labs were reviewed with the patient, and abnormal results were discussed with the patient.  The patient is able to verbalize their individual safety plan to this provider.  # It is recommended to the patient to continue psychiatric medications as prescribed, after discharge from the hospital.    # It is recommended to the patient to follow up with your outpatient psychiatric provider and PCP.  # It was discussed with the patient, the impact of alcohol, drugs, tobacco have been there overall psychiatric and medical wellbeing, and total abstinence from substance use was recommended the patient.ed.  # Prescriptions provided or sent directly to preferred pharmacy at discharge. Patient agreeable to plan. Given opportunity to ask questions. Appears to feel comfortable with discharge.    # In the event of worsening symptoms, the patient is instructed to call the crisis hotline, 911 and or go to the nearest ED for appropriate evaluation and treatment of symptoms. To follow-up with primary care provider for other medical issues, concerns and or health care needs  # Patient was discharged to self care with mom with a plan to follow up as noted below.   Physical Findings: AIMS:  , ,  ,  ,  ,  ,   Not on atypical antipsychotic  CIWA:    COWS:     Musculoskeletal: Strength & Muscle Tone: within normal limits Gait & Station: normal Patient  leans: N/A   Psychiatric Specialty Exam:  Presentation  General Appearance:  Appropriate  for Environment; Casual  Eye Contact: Good  Speech: Clear and Coherent; Normal Rate  Speech Volume: Normal  Handedness: Right   Mood and Affect  Mood: Euthymic  Affect: Appropriate; Congruent   Thought Process  Thought Processes: Coherent; Goal Directed; Linear  Descriptions of Associations:Intact  Orientation:Full (Time, Place and Person)  Thought Content:Logical  History of Schizophrenia/Schizoaffective disorder:No  Duration of Psychotic Symptoms:No data recorded Hallucinations:Hallucinations: None  Ideas of Reference:None  Suicidal Thoughts:Suicidal Thoughts: No SI Active Intent and/or Plan: Without Means to Carry Out; Without Intent; Without Plan; Without Access to Means  Homicidal Thoughts:Homicidal Thoughts: No   Sensorium  Memory: Immediate Good; Recent Good  Judgment: Fair  Insight: Good   Executive Functions  Concentration: Good  Attention Span: Good  Recall: Good  Fund of Knowledge: Good  Language: Good   Psychomotor Activity  Psychomotor Activity: Psychomotor Activity: Normal   Assets  Assets: Communication Skills; Desire for Improvement; Housing; Health and safety inspector; Resilience; Social Support; English as a second language teacher; Vocational/Educational; Physical Health   Sleep  Sleep: Sleep: Fair  Estimated Sleeping Duration (Last 24 Hours): 4.50-5.75 hours   Physical Exam: Physical Exam Constitutional:      Appearance: Normal appearance.  Eyes:     Conjunctiva/sclera: Conjunctivae normal.  Pulmonary:     Effort: Pulmonary effort is normal.  Musculoskeletal:        General: Normal range of motion.  Neurological:     Mental Status: He is oriented to person, place, and time.     Review of Systems  Constitutional:  Negative for chills, diaphoresis and fever.  Respiratory:  Negative for cough.   Gastrointestinal:   Negative for nausea and vomiting.  Neurological:  Negative for headaches.  Psychiatric/Behavioral:  Negative for depression, hallucinations, substance abuse and suicidal ideas. The patient is nervous/anxious. The patient does not have insomnia.   Blood pressure (!) 140/84, pulse 83, temperature 98.3 F (36.8 C), temperature source Oral, resp. rate 16, height 5' 9 (1.753 m), weight 122.5 kg, SpO2 100%. Body mass index is 39.87 kg/m.   Social History   Tobacco Use  Smoking Status Some Days   Types: Cigars  Smokeless Tobacco Never   Tobacco Cessation:  A prescription for an FDA-approved tobacco cessation medication was offered at discharge and the patient refused  Blood Alcohol level:  Lab Results  Component Value Date   [facility] <15 03/08/2024    Metabolic Disorder Labs:  No results found for: HGBA1C, MPG No results found for: PROLACTIN Lab Results  Component Value Date   CHOL 270 (H) 03/08/2024   TRIG 139 03/08/2024   HDL 58 03/08/2024   CHOLHDL 4.7 03/08/2024   VLDL 28 03/08/2024   LDLCALC 184 (H) 03/08/2024    See Psychiatric Specialty Exam and Suicide Risk Assessment completed by Attending Physician prior to discharge.  Discharge destination:  Home  Is patient on multiple antipsychotic therapies at discharge:  No   Has Patient had three or more failed trials of antipsychotic monotherapy by history:  No  Recommended Plan for Multiple Antipsychotic Therapies: NA   Allergies as of 03/09/2024       Reactions   Peanut-containing Drug Products Hives        Medication List     TAKE these medications      Indication  hydrOXYzine  25 MG tablet Commonly known as: ATARAX  Take 1 tablet (25 mg total) by mouth 3 (three) times daily as needed for anxiety.  Indication: Feeling Anxious   traZODone  50 MG tablet Commonly known as: DESYREL  Take  1 tablet (50 mg total) by mouth at bedtime as needed for sleep.  Indication: Trouble Sleeping        Follow-up  Information     [facility] Follow up on 03/17/2024.   Why: You have an appt for outpatient therapy on 03/17/2024 at 10:00 am, this is an in person appt. Contact information: [address],  [place], [place] [address] Phone: [phone] FAX: [phone]        [facility] Follow up on 03/13/2024.   Why: Please call this provider on 03/13/24 at 9:00 am to schedule an appointment for medication management services. Contact information: [address]  [address] [place] [address] [phone]                 Follow-up recommendations:    Activity: as tolerated   Diet: heart healthy   Other: -Follow-up with your outpatient psychiatric provider -instructions on appointment date, time, and address (location) are provided to you in discharge paperwork.   -Take your psychiatric medications as prescribed at discharge - instructions are provided to you in the discharge paperwork   -Follow-up with outpatient primary care doctor and other specialists -for management of preventative medicine and chronic medical disease None    -Testing: Follow-up with outpatient provider for abnormal lab results:  Cholesterol 270 and LDL cholesterol 184, follow-up with primary provider    -If you are prescribed an atypical antipsychotic medication, we recommend that your outpatient psychiatrist follow routine screening for side effects within 3 months of discharge, including monitoring: AIMS scale, height, weight, blood pressure, fasting lipid panel, HbA1c, and fasting blood sugar.    -Recommend total abstinence from alcohol, tobacco, and other illicit drug use at discharge.    -If your psychiatric symptoms recur, worsen, or if you have side effects to your psychiatric medications, call your outpatient psychiatric provider, 911, [phone] or go to the nearest emergency department.   -If suicidal thoughts occur, immediately call your outpatient psychiatric provider, 911, 988  or go to the nearest emergency department.  Signed: PATTI OLDEN, MD 03/09/2024, 2:07 PM

## 2024-03-09 NOTE — BH IP Treatment Plan (Signed)
 Interdisciplinary Treatment and Diagnostic Plan Update  03/09/2024 Time of Session: 10:35AM James Mckenzie MRN: [id number]  Principal Diagnosis: Suicidal ideation  Secondary Diagnoses: Principal Problem:   Suicidal ideation   Current Medications:  Current Facility-Administered Medications  Medication Dose Route Frequency Provider Last Rate Last Admin   acetaminophen  (TYLENOL ) tablet 650 mg  650 mg Oral Q6H PRN Hobson, Fran E, NP       alum & mag hydroxide-simeth (MAALOX/MYLANTA) 200-200-20 MG/5ML suspension 30 mL  30 mL Oral Q4H PRN Hobson, Fran E, NP       magnesium  hydroxide (MILK OF MAGNESIA) suspension 30 mL  30 mL Oral Daily PRN Hobson, Fran E, NP       OLANZapine  (ZYPREXA ) injection 10 mg  10 mg Intramuscular TID PRN Hobson, Fran E, NP       OLANZapine  (ZYPREXA ) injection 5 mg  5 mg Intramuscular TID PRN Hobson, Fran E, NP       OLANZapine  zydis (ZYPREXA ) disintegrating tablet 5 mg  5 mg Oral TID PRN Hobson, Fran E, NP       traZODone  (DESYREL ) tablet 50 mg  50 mg Oral QHS PRN Ajibola, Ene A, NP   50 mg at 03/08/24 2111   PTA Medications: No medications prior to admission.    Patient Stressors: Marital or family conflict    Patient Strengths: Average or above average intelligence  Capable of independent living   Treatment Modalities: Medication Management, Group therapy, Case management,  1 to 1 session with clinician, Psychoeducation, Recreational therapy.   Physician Treatment Plan for Primary Diagnosis: Suicidal ideation Long Term Goal(s):     Short Term Goals:    Medication Management: Evaluate patient's response, side effects, and tolerance of medication regimen.  Therapeutic Interventions: 1 to 1 sessions, Unit Group sessions and Medication administration.  Evaluation of Outcomes: Not Progressing  Physician Treatment Plan for Secondary Diagnosis: Principal Problem:   Suicidal ideation  Long Term Goal(s):     Short Term Goals:       Medication  Management: Evaluate patient's response, side effects, and tolerance of medication regimen.  Therapeutic Interventions: 1 to 1 sessions, Unit Group sessions and Medication administration.  Evaluation of Outcomes: Not Progressing   RN Treatment Plan for Primary Diagnosis: Suicidal ideation Long Term Goal(s): Knowledge of disease and therapeutic regimen to maintain health will improve  Short Term Goals: Ability to remain free from injury will improve, Ability to verbalize frustration and anger appropriately will improve, Ability to demonstrate self-control, Ability to participate in decision making will improve, Ability to verbalize feelings will improve, Ability to disclose and discuss suicidal ideas, Ability to identify and develop effective coping behaviors will improve, and Compliance with prescribed medications will improve  Medication Management: RN will administer medications as ordered by provider, will assess and evaluate patient's response and provide education to patient for prescribed medication. RN will report any adverse and/or side effects to prescribing provider.  Therapeutic Interventions: 1 on 1 counseling sessions, Psychoeducation, Medication administration, Evaluate responses to treatment, Monitor vital signs and CBGs as ordered, Perform/monitor CIWA, COWS, AIMS and Fall Risk screenings as ordered, Perform wound care treatments as ordered.  Evaluation of Outcomes: Not Progressing   LCSW Treatment Plan for Primary Diagnosis: Suicidal ideation Long Term Goal(s): Safe transition to appropriate next level of care at discharge, Engage patient in therapeutic group addressing interpersonal concerns.  Short Term Goals: Engage patient in aftercare planning with referrals and resources, Increase social support, Increase ability to appropriately verbalize feelings, Increase emotional regulation,  Facilitate acceptance of mental health diagnosis and concerns, Facilitate patient progression  through stages of change regarding substance use diagnoses and concerns, and Identify triggers associated with mental health/substance abuse issues  Therapeutic Interventions: Assess for all discharge needs, 1 to 1 time with Social worker, Explore available resources and support systems, Assess for adequacy in community support network, Educate family and significant other(s) on suicide prevention, Complete Psychosocial Assessment, Interpersonal group therapy.  Evaluation of Outcomes: Not Progressing   Progress in Treatment: Attending groups: Yes. Participating in groups: Yes. Taking medication as prescribed: Yes. Toleration medication: Yes. Family/Significant other contact made: No, will contact:  family once consent is given. Patient understands diagnosis: Yes. Discussing patient identified problems/goals with staff: Yes. Medical problems stabilized or resolved: Yes. Denies suicidal/homicidal ideation: Yes. Issues/concerns per patient self-inventory: No.  Patient Goals:  I don't really have any goals right now. I know why I'm here and moving forward don't want to act based on emotion/heartbreak.  Discharge Plan or Barriers: Pt will likely discharge home once stable.   Reason for Continuation of Hospitalization: Depression Medication stabilization  Estimated Length of Stay: 5-7 days  Last 3 [place] Suicide Severity Risk Score: Flowsheet Row [facility] (Current[facility] Most recent reading at 03/08/2024  1:32 PM ED from 03/08/2024 in [facility] Most recent reading at 03/08/2024 10:58 AM ED from 09/14/2023 in [facility] at [facility] Most recent reading at 09/14/2023  4:40 AM  C-SSRS RISK CATEGORY High Risk High Risk No Risk    Last PHQ 2/9 Scores:    03/24/2019    8:21 AM  Depression screen PHQ 2/9  Decreased Interest 0  Down, Depressed, Hopeless 0  PHQ - 2 Score  0  Altered sleeping 0  Tired, decreased energy 0  Change in appetite 0  Feeling bad or failure about yourself  0  Trouble concentrating 0  Moving slowly or fidgety/restless 0  Suicidal thoughts 0  PHQ-9 Score 0  Difficult doing work/chores Not difficult at all    Scribe for Treatment Team: Gilles Trimpe M Brittin [place], [place] 03/09/2024 11:39 AM

## 2024-03-09 NOTE — Progress Notes (Signed)
Pt did not attend emotional wellness group

## 2024-03-09 NOTE — [facility] Suicide Risk Assessment (Signed)
 [facility] INPATIENT:  Family/Significant Other Suicide Prevention Education  Suicide Prevention Education:  Education Completed; James Mckenzie (mother) [phone],  (name of family member/significant other) has been identified by the patient as the family member/significant other with whom the patient will be residing, and identified as the person(s) who will aid the patient in the event of a mental health crisis (suicidal ideations/suicide attempt).  With written consent from the patient, the family member/significant other has been provided the following suicide prevention education, prior to the and/or following the discharge of the patient.  James Mckenzie (mother) confirmed patient will not have access to firearms/guns/weapons after discharge. James Mckenzie confirmed she will be able to assist patient should he have a mental health crisis. James Mckenzie reported she will be able to assist patient with safely storing and securing medications should he need assistance. James Mckenzie denied having any safety concerns related to patient discharging home.   The suicide prevention education provided includes the following: Suicide risk factors Suicide prevention and interventions National Suicide Hotline telephone number [facility] assessment telephone number [facility] Emergency Assistance 911 [facility] and/or Residential Mobile Crisis Unit telephone number  Request made of family/significant other to: Remove weapons (e.g., guns, rifles, knives), all items previously/currently identified as safety concern.   Remove drugs/medications (over-the-counter, prescriptions, illicit drugs), all items previously/currently identified as a safety concern.  The family member/significant other verbalizes understanding of the suicide prevention education information provided.  The family member/significant other agrees to remove the items of safety concern listed above.  Corrie Brannen M Tora Prunty, LCSWA 03/09/2024, 1:25 PM

## 2024-03-09 NOTE — H&P (Signed)
 Psychiatric Admission Assessment Adult  Patient Identification: James Mckenzie MRN:  [id number] Date of Evaluation:  03/09/2024 Chief Complaint:  Suicidal ideation [R45.851] Principal Diagnosis: Suicidal ideation Diagnosis:  Principal Problem:   Suicidal ideation  History of Present Illness:   James Mckenzie is a 28 y.o. male with no significant psychiatric history who presented to the behavioral health urgent care center on 8/17 for suicidal ideations with a plan to use a gun. He was transferred to the [facility] symptomatic stabilization and further psychiatric evaluation.  On intake exam, the patient was interviewed in the presence of the attending physician and a medical student.  Patient reports that he was cheating on his wife with another woman prior to this admission.  The other woman ended up getting pregnant, had an abortion and he ended that relationship.  On Saturday, the woman reached out to him and informed him that he was going to tell his wife about their relationship.  As a result the patient disclosed to the wife that he he was unfaithful and had had sex outside of their marriage.  As a result, the wife was very upset and ended up leaving the home.  His parents and her parents eventually came to the house, and later that day he ended up at his mother's house.  During that time he had a dream, I imagine how life would be different if I was no longer here.  Patient reports that he became overwhelmed and really impulsive, and asked when he found his mother's gun and locked himself in the bathroom.  Patient reports that his actions were due to him being impulsive and being overwhelmed by the consequences of his actions.  He suspects that he has lost his wife due to the affair, I have to live with the consequences of my actions, I can't let this moment define me.  Patient says that he has support from his mom who plans to stay with him once discharged, a  brother who is traveling as well to stay for 1 week and a friend traveling from [place]  who will be staying with him to monitor and support him for an additional week. Patient reports that he is a [place] and needs to trust in God during the situation.  Reports that his faith is important to him and he needs to turn his focus and motivation towards that at the current moment.  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  He denies any suicidal ideations currently, prior attempts, self harm behavior, homicidal ideations, auditory or visual hallucinations.  Patient denies any previous evaluations by a psychiatrist or inpatient hospitalizations.  Patient previously saw a therapist to deal with job-related stress, however is not currently.  Patient is interested in getting therapeutic services closer to home in [place], [place] .  Patient deferred trialing any medications at the current moment  Collateral Information, Mom, Mrs. Quamere Mussell [phone])  Patient granted consent to discuss hospitalization, gave further collateral and discuss dispo planning without restrictions.  She is a retired Hydrographic surveyor, prior to them picking up her son, they moved the gun in their home to an alternative location. Patient found the gun and locked himself in the bathroom. She was alerted by her cousin that James Mckenzie was in the bathroom with the gun. Husband broke down the bathroom, and he was in the bathroom. The gun was sitting on the bathroom counter, without  a bullet in the chamber and the patient was crying inside the bathroom. She reports he sounds sounds better today and disclosed that he had no intention to commit suicide. She suspects, he did the act  as a cry for help to his wife and denies any prior or recent psychiatric concerns.  She denies any safety concerns with him returning at this time. She will be able to monitor him  and plans to stay with him.  She reports no firearms will be on the property, no large stockpiles of pills and no weapons will be available.  Her husband who is also a former Marine disclosed that he did not have any concerns with Keen being discharged today and would be there to also help support him.  Associated Signs/Symptoms: Depression Symptoms:  suicidal thoughts with specific plan, in context of wife finding out about infidelity (Hypo) Manic Symptoms:  Denies  Anxiety Symptoms:  Denies any significant symptoms, some work related stress, better controlled after therapy   Psychotic Symptoms:   Denies  PTSD Symptoms:  Denies  Total Time spent with patient: 1 hour  Past Psychiatric Hx: Previous Psych Diagnoses: None Prior inpatient treatment: None  Current/prior outpatient treatment: Patient denies, EMR shows Lexapro  10 mg daily  Prior rehab hx: Denies  Psychotherapy hx: Yes, remotely, within the last year  History of suicide: Denies History of homicide or aggression: Denies   Psychiatric medication history: EMR shows Lexapro  10 mg daily  Psychiatric medication compliance history: None  Neuromodulation history: Denies Current Psychiatrist: Denies Current therapist: Yes, remotely, within the last year to manage work related stress   Substance Abuse Hx: Alcohol: Occasional  Tobacco: Cigars x2-3 monthly Illicit drugs: Denies  Rx drug abuse: Denies  Rehab hx: Denies   Past Medical History: Medical Diagnoses: Migraine  Home Rx: Sumatriptan  and Topiramate  Prior Hosp: None reported  Prior Surgeries/Trauma: Tonsillectomy, adenoidectomy at [age] years old  Head trauma, LOC, concussions, seizures:  Denies  Migraine: x2-3 weekly Allergies: NKDA LMP: N/A  PCP: Deferred   Family History: Medical: None reported  Psych: Unaware  Psych Rx: Unaware  SA/HA: Maternal Uncle SA  Substance use family hx: Crack   Social History: Childhood (bring, raised, lives now, parents,  siblings, schooling, education): Patient grew up in [place]  with mom, dad and younger brother.  He graduated from [employer] and attended [employer]. Abuse: Denies  Marital Status: Married  Sexual orientation: Heterosexual  Children: Did not ask  Employment: Warehouse manager Group: support with mom, brother and several friends  Housing: Home, mom plans to stay with him for some time  Legal: No ongoing Paediatric nurse: Denies  Firearms: No firearms in personal residence   Is the patient at risk to self? No.  Has the patient been a risk to self in the past 6 months? No.  Has the patient been a risk to self within the distant past? No.  Is the patient a risk to others? No.  Has the patient been a risk to others in the past 6 months? No.  Has the patient been a risk to others within the distant past? No.   [place] Scale:  Flowsheet Row [facility] ([facility] Most recent reading at 03/08/2024  1:32 PM ED from 03/08/2024 in [facility] Most recent reading at 03/08/2024 10:58 AM ED from 09/14/2023 in [facility] Emergency Department at [facility] Most recent reading at 09/14/2023  4:40 AM  C-SSRS RISK CATEGORY High Risk High Risk No Risk     Prior Inpatient Therapy: No. Prior Outpatient Therapy: Yes.   If yes, had a therapist to deal with work related stress.    Alcohol Screening: 1. How often do you have a drink containing alcohol?: 2 to 4 times a month 2. How many drinks containing alcohol do you have on a typical day when you are drinking?: 3 or 4 3. How often do you have six or more drinks on one occasion?: Never AUDIT-C Score: 3 4. How often during the last year have you found that you were not able to stop drinking once you had started?: Never 5. How often during the last year have you failed to do what was normally expected from you  because of drinking?: Never 6. How often during the last year have you needed a first drink in the morning to get yourself going after a heavy drinking session?: Never 7. How often during the last year have you had a feeling of guilt of remorse after drinking?: Never 8. How often during the last year have you been unable to remember what happened the night before because you had been drinking?: Never 9. Have you or someone else been injured as a result of your drinking?: No 10. Has a relative or friend or a doctor or another health worker been concerned about your drinking or suggested you cut down?: No Alcohol Use Disorder Identification Test Final Score (AUDIT): 3 Alcohol Brief Interventions/Follow-up: Alcohol education/Brief advice Substance Abuse History in the last 12 months:  No. Consequences of Substance Abuse: NA Previous Psychotropic Medications: Per chart review Lexapro  10 mg daily  Psychological Evaluations: No  Past Medical History:  Past Medical History:  Diagnosis Date   Allergy    Fainting spell 10/2010   Migraine    Tonsil and adenoid disease, chronic     Past Surgical History:  Procedure Laterality Date   DENTAL SURGERY     TONSILLECTOMY     Family History:  Family History  Problem Relation Age of Onset   Hypertension Mother    Hypertension Father    Arthritis Maternal Grandmother    Cancer Maternal Grandmother        breast   Hyperlipidemia Maternal Grandmother    Hypertension Maternal Grandmother    Hyperlipidemia Maternal Grandfather    Hypertension Maternal Grandfather    Diabetes Maternal Grandfather    Hyperlipidemia Paternal Grandmother    Hypertension Paternal Grandmother    Cancer Paternal Grandfather        prostate   Hyperlipidemia Paternal Grandfather    Hypertension Paternal Grandfather    Family Psychiatric  History:  Tobacco Screening:  Social History   Tobacco Use  Smoking Status Some Days   Types: Cigars  Smokeless Tobacco Never     BH Tobacco Counseling     Are you interested in Tobacco Cessation Medications?  No, patient refused Counseled patient on smoking cessation:  Refused/Declined practical counseling Reason Tobacco Screening Not Completed: No value filed.       Social History:  Social History   Substance and Sexual Activity  Alcohol Use Yes   Alcohol/week: 2.0 standard drinks of alcohol   Types: 2 Glasses of wine per week   Comment: Daily     Social History   Substance and Sexual Activity  Drug Use Never    Additional Social History: Marital status: Married Number of Years  Married: 3 What types of issues is patient dealing with in the relationship?: Patient reports that he cheated on his wife and now she will not talk to him. Additional relationship information: none reported Are you sexually active?: Yes What is your sexual orientation?: Straight Has your sexual activity been affected by drugs, alcohol, medication, or emotional stress?: none reported Does patient have children?: No                         Allergies:   Allergies  Allergen Reactions   Peanut-Containing Drug Products Hives   Lab Results:  Results for orders placed or performed during the hospital encounter of 03/08/24 (from the past 48 hours)  POCT Urine Drug Screen - (I-Screen)     Status: Normal   Collection Time: 03/08/24  9:43 AM  Result Value Ref Range   POC Amphetamine UR None Detected NONE DETECTED (Cut Off Level 1000 ng/mL)   POC Secobarbital (BAR) None Detected NONE DETECTED (Cut Off Level 300 ng/mL)   POC Buprenorphine (BUP) None Detected NONE DETECTED (Cut Off Level 10 ng/mL)   POC Oxazepam (BZO) None Detected NONE DETECTED (Cut Off Level 300 ng/mL)   POC Cocaine UR None Detected NONE DETECTED (Cut Off Level 300 ng/mL)   POC Methamphetamine UR None Detected NONE DETECTED (Cut Off Level 1000 ng/mL)   POC Morphine None Detected NONE DETECTED (Cut Off Level 300 ng/mL)   POC Methadone UR None Detected  NONE DETECTED (Cut Off Level 300 ng/mL)   POC Oxycodone UR None Detected NONE DETECTED (Cut Off Level 100 ng/mL)   POC Marijuana UR None Detected NONE DETECTED (Cut Off Level 50 ng/mL)  CBC with Differential/Platelet     Status: None   Collection Time: 03/08/24  9:59 AM  Result Value Ref Range   WBC 7.4 4.0 - 10.5 K/uL   RBC 5.64 4.22 - 5.81 MIL/uL   Hemoglobin 15.9 13.0 - 17.0 g/dL   HCT 51.7 60.9 - 47.9 %   MCV 85.5 80.0 - 100.0 fL   MCH 28.2 26.0 - 34.0 pg   MCHC 33.0 30.0 - 36.0 g/dL   RDW 85.6 88.4 - 84.4 %   Platelets 241 150 - 400 K/uL   nRBC 0.0 0.0 - 0.2 %   Neutrophils Relative % 68 %   Neutro Abs 5.0 1.7 - 7.7 K/uL   Lymphocytes Relative 24 %   Lymphs Abs 1.7 0.7 - 4.0 K/uL   Monocytes Relative 7 %   Monocytes Absolute 0.5 0.1 - 1.0 K/uL   Eosinophils Relative 0 %   Eosinophils Absolute 0.0 0.0 - 0.5 K/uL   Basophils Relative 1 %   Basophils Absolute 0.0 0.0 - 0.1 K/uL   Immature Granulocytes 0 %   Abs Immature Granulocytes 0.02 0.00 - 0.07 K/uL    Comment: Performed at [facility], [address] [place], [place] [address]  Comprehensive metabolic panel     Status: Abnormal   Collection Time: 03/08/24  9:59 AM  Result Value Ref Range   Sodium 138 135 - 145 mmol/L   Potassium 4.2 3.5 - 5.1 mmol/L   Chloride 98 98 - 111 mmol/L   CO2 27 22 - 32 mmol/L   Glucose, Bld 107 (H) 70 - 99 mg/dL    Comment: Glucose reference range applies only to samples taken after fasting for at least 8 hours.   BUN 8 6 - 20 mg/dL   Creatinine, Ser 8.98 0.61 -  1.24 mg/dL   Calcium 89.8 8.9 - 89.6 mg/dL   Total Protein 7.9 6.5 - 8.1 g/dL   Albumin 4.6 3.5 - 5.0 g/dL   AST 17 15 - 41 U/L   ALT 29 0 - 44 U/L   Alkaline Phosphatase 60 38 - 126 U/L   Total Bilirubin 1.2 0.0 - 1.2 mg/dL   GFR, Estimated >39 >39 mL/min    Comment: (NOTE) Calculated using the CKD-EPI Creatinine Equation (2021)    Anion gap 13 5 - 15    Comment: Performed at [facility], [address], [place], [place] [address]  Ethanol     Status: None   Collection Time: 03/08/24  9:59 AM  Result Value Ref Range   Alcohol, Ethyl (B) <15 <15 mg/dL    Comment: (NOTE) For medical purposes only. Performed at [facility], [address] [place], [place] [address]   Lipid panel     Status: Abnormal   Collection Time: 03/08/24  9:59 AM  Result Value Ref Range   Cholesterol 270 (H) 0 - 200 mg/dL   Triglycerides 860 <849 mg/dL   HDL 58 >59 mg/dL   Total CHOL/HDL Ratio 4.7 RATIO   VLDL 28 0 - 40 mg/dL   LDL Cholesterol 815 (H) 0 - 99 mg/dL    Comment:        Total Cholesterol/HDL:CHD Risk Coronary Heart Disease Risk Table                     Men   Women  1/2 Average Risk   3.4   3.3  Average Risk       5.0   4.4  2 X Average Risk   9.6   7.1  3 X Average Risk  23.4   11.0        Use the calculated Patient Ratio above and the CHD Risk Table to determine the patient's CHD Risk.        ATP III CLASSIFICATION (LDL):  <100     mg/dL   Optimal  899-870  mg/dL   Near or Above                    Optimal  130-159  mg/dL   Borderline  839-810  mg/dL   High  >809     mg/dL   Very High Performed at [facility], [address], [place], [place] [address]   TSH     Status: None   Collection Time: 03/08/24  9:59 AM  Result Value Ref Range   TSH 1.858 0.350 - 4.500 uIU/mL    Comment: Performed by a 3rd Generation assay with a functional sensitivity of <=0.01 uIU/mL. Performed at [facility], [address], [place], [place] [address]     Blood Alcohol level:  Lab Results  Component Value Date   [facility] <15 03/08/2024    Metabolic Disorder Labs:  No results found for: HGBA1C, MPG No results found for: PROLACTIN Lab Results  Component Value Date   CHOL 270 (H) 03/08/2024   TRIG 139 03/08/2024   HDL 58 03/08/2024   CHOLHDL 4.7 03/08/2024   VLDL 28 03/08/2024   LDLCALC 184 (H) 03/08/2024    Current Medications: Current Facility-Administered  Medications  Medication Dose Route Frequency Provider Last Rate Last Admin   acetaminophen  (TYLENOL ) tablet 650 mg  650 mg Oral Q6H PRN Hobson, Fran E, NP  alum & mag hydroxide-simeth (MAALOX/MYLANTA) 200-200-20 MG/5ML suspension 30 mL  30 mL Oral Q4H PRN Hobson, Fran E, NP       magnesium  hydroxide (MILK OF MAGNESIA) suspension 30 mL  30 mL Oral Daily PRN Hobson, Fran E, NP       OLANZapine  (ZYPREXA ) injection 10 mg  10 mg Intramuscular TID PRN Hobson, Fran E, NP       OLANZapine  (ZYPREXA ) injection 5 mg  5 mg Intramuscular TID PRN Hobson, Fran E, NP       OLANZapine  zydis (ZYPREXA ) disintegrating tablet 5 mg  5 mg Oral TID PRN Hobson, Fran E, NP       traZODone  (DESYREL ) tablet 50 mg  50 mg Oral QHS PRN Ajibola, Ene A, NP   50 mg at 03/08/24 2111   PTA Medications: No medications prior to admission.    AIMS:  ,  ,  ,  ,  ,  ,   Not on atypical antipsychotic   Musculoskeletal: Strength & Muscle Tone: within normal limits Gait & Station: normal Patient leans: N/A            Psychiatric Specialty Exam:  Presentation  General Appearance:  Appropriate for Environment; Casual  Eye Contact: Good  Speech: Clear and Coherent; Normal Rate  Speech Volume: Normal  Handedness: Right   Mood and Affect  Mood: Euthymic  Affect: Appropriate; Congruent   Thought Process  Thought Processes: Coherent; Goal Directed; Linear  Duration of Psychotic Symptoms:N/A Past Diagnosis of Schizophrenia or Psychoactive disorder: No  Descriptions of Associations:Intact  Orientation:Full (Time, Place and Person)  Thought Content:Logical  Hallucinations:Hallucinations: None  Ideas of Reference:None  Suicidal Thoughts:Suicidal Thoughts: No SI Active Intent and/or Plan: Without Means to Carry Out; Without Intent; Without Plan; Without Access to Means  Homicidal Thoughts:Homicidal Thoughts: No   Sensorium  Memory: Immediate Good; Recent  Good  Judgment: Fair  Insight: Good   Executive Functions  Concentration: Good  Attention Span: Good  Recall: Good  Fund of Knowledge: Good  Language: Good   Psychomotor Activity  Psychomotor Activity: Psychomotor Activity: Normal   Assets  Assets: Communication Skills; Desire for Improvement; Housing; Health and safety inspector; Resilience; Social Support; English as a second language teacher; Vocational/Educational; Physical Health   Sleep  Sleep: Sleep: Fair  Estimated Sleeping Duration (Last 24 Hours): 4.75-6.00 hours   Physical Exam: Physical Exam Constitutional:      Appearance: Normal appearance.  Eyes:     Conjunctiva/sclera: Conjunctivae normal.  Pulmonary:     Effort: Pulmonary effort is normal.  Musculoskeletal:        General: Normal range of motion.  Neurological:     Mental Status: He is oriented to person, place, and time.    Review of Systems  Constitutional:  Negative for chills, diaphoresis and fever.  Respiratory:  Negative for cough.   Gastrointestinal:  Negative for nausea and vomiting.  Neurological:  Negative for headaches.  Psychiatric/Behavioral:  Negative for depression, hallucinations, substance abuse and suicidal ideas. The patient is nervous/anxious. The patient does not have insomnia.    Blood pressure (!) 140/84, pulse 83, temperature 98.3 F (36.8 C), temperature source Oral, resp. rate 16, height 5' 9 (1.753 m), weight 122.5 kg, SpO2 100%. Body mass index is 39.87 kg/m.  Treatment Plan Summary:  ASSESSMENT:  Diagnoses / Active Problems:   James Mckenzie is a 28 y.o. male with no significant psychiatric history who presented to the behavioral health urgent care center on 8/17 for suicidal ideations with a plan to use a gun.  He was transferred to the [facility] symptomatic stabilization and further psychiatric evaluation.  On intake assessment, the patient is linear and goal directed towards  motivations.  He denies any mood symptoms suggestive of a depressive, bipolar or psychotic disorder.  Patient denies suicidal ideations or homicidal ideations this morning.  Patient reports that incident over the weekend was an impulsive decision and being overwhelmed by his wife finding about his infidelity.  Patient reports that he is now ready to face the consequences of his decision losing my wife and that this 1 moment will not define him. He reports some regret over actions towards wife. Patient seems to have good insight over the situation, wants to restart therapy and also has a good support network in place. He reports that he has good support with mom, brother and friends who plans to come and stay with him over the next several weeks.  Confirmed with mom who denies any safety concerns with the son being discharged today, she suspects that his suicidal gesture was, a cry out for help to his wife.  She was made aware of the plan for discharge and denies any acute concerns regarding his mood. We will defer starting medications at this time per patient request.  Patient also has upcoming mandatory obligation with work tomorrow, if he were to remain inpatient we would also risk the alternative of worsening symptom stabilization of losing his job. He will be discharged on 8/18 to mom with therapy services arranged.   PLAN: Safety and Monitoring:  --  Voluntary admission to inpatient psychiatric unit for safety, stabilization and treatment  -- Daily contact with patient to assess and evaluate symptoms and progress in treatment  -- Patient's case to be discussed in multi-disciplinary team meeting  -- Observation Level : q15 minute checks  -- Vital signs:  q12 hours  -- Precautions: suicide, elopement, and assault  2. Psychiatric Diagnoses and Treatment:   -- Continue hydroxyzine  25 mg three times daily for anxiety as needed   -- Continue Trazodone  50 mg at bedtime as needed for insomnia  -- The  risks/benefits/side-effects/alternatives to this medication were discussed in detail with the patient and time was given for questions. The patient consents to medication trial.  -- FDA  -- Metabolic profile and EKG monitoring obtained while on an atypical antipsychotic (BMI: Lipid Panel: HbgA1c: QTc:)   -- Encouraged patient to participate in unit milieu and in scheduled group therapies   -- Short Term Goals: Ability to verbalize feelings will improve, Ability to demonstrate self-control will improve, and Ability to identify and develop effective coping behaviors will improve  -- Long Term Goals: Improvement in symptoms so as ready for discharge    3. Medical Issues Being Addressed:   None  4. Discharge Planning:   -- Social work and case management to assist with discharge planning and identification of hospital follow-up needs prior to discharge  -- Estimated LOS: 1-2 days, discharge this evening 8/18   -- Discharge Concerns:Minimal, patient has good support network and therapy services arranged to work  -- Discharge Goals: Return home with outpatient referrals for mental health follow-up including medication management/psychotherapy  I certify that inpatient services furnished can reasonably be expected to improve the patient's condition.    Verdelle Valtierra, MD 8/18/20251:33 PM

## 2024-03-09 NOTE — BHH Suicide Risk Assessment (Addendum)
 [facility] Admission Suicide Risk Assessment  Nursing information obtained from:  Patient Demographic factors:  Male, Access to firearms Current Mental Status:  Suicidal ideation indicated by patient Loss Factors:  Loss of significant relationship Historical Factors:  NA Risk Reduction Factors:  Positive social support  Total Time spent with patient: 1 hour Principal Problem: Suicidal ideation Diagnosis:  Principal Problem:   Suicidal ideation  Subjective Data:   James Mckenzie is a 28 y.o. male with no significant psychiatric history who presented to the behavioral health urgent care center on 8/17 for suicidal ideations with a plan to use a gun. He was transferred to the [facility] symptomatic stabilization and further psychiatric evaluation.   On intake exam, the patient was interviewed in the presence of the attending physician and a medical student.  Patient reports that he was cheating on his wife with another woman prior to this admission.  The other woman ended up getting pregnant, had an abortion and he ended that relationship.  On Saturday, the woman reached out to him and informed him that he was going to tell his wife about their relationship.  As a result the patient disclosed to the wife that he he was unfaithful and had had sex outside of their marriage.   As a result, the wife was very upset and ended up leaving the home.  His parents and her parents eventually came to the house, and later that day he ended up at his mother's house.  During that time he had a dream, I imagine how life would be different if I was no longer here.  Patient reports that he became overwhelmed and really impulsive, and asked when he found his mother's gun and locked himself in the bathroom.   Patient reports that his actions were due to him being impulsive and being overwhelmed by the consequences of his actions.  He suspects that he has lost his wife due to the affair, I have  to live with the consequences of my actions, I can't let this moment define me.  Patient says that he has support from his mom who plans to stay with him once discharged, a brother who is traveling as well to stay for 1 week and a friend traveling from [place]  who will be staying with him to monitor and support him for an additional week. Patient reports that he is a [place] and needs to trust in God during the situation.  Reports that his faith is important to him and he needs to turn his focus and motivation towards that at the current moment.  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  He denies any suicidal ideations currently, prior attempts, self harm behavior, homicidal ideations, auditory or visual hallucinations.   Patient denies any previous evaluations by a psychiatrist or inpatient hospitalizations.  Patient previously saw a therapist to deal with job-related stress, however is not currently.  Patient is interested in getting therapeutic services closer to home in [place], [place] .  Patient deferred trialing any medications at the current moment   Collateral Information, Mom, James Mckenzie [phone])  Patient granted consent to discuss hospitalization, gave further collateral and discuss dispo planning without restrictions.   She is a retired Hydrographic surveyor, prior to them picking up her son, they moved the gun in their home to an alternative location. Patient found the gun and locked himself in the bathroom.  She was alerted by her cousin that James Mckenzie was in the bathroom with the gun. Husband broke down the bathroom, and he was in the bathroom. The gun was sitting on the bathroom counter, without a bullet in the chamber and the patient was crying inside the bathroom. She reports he sounds sounds better today and disclosed that he had no intention to commit suicide. She suspects, he did the act  as a  cry for help to his wife and denies any prior or recent psychiatric concerns.  She denies any safety concerns with him returning at this time. She will be able to monitor him and plans to stay with him.  She reports no firearms will be on the property, no large stockpiles of pills and no weapons will be available.  Her husband who is also a former Marine disclosed that he did not have any concerns with James Mckenzie being discharged today and would be there to also help support him.   Associated Signs/Symptoms: Depression Symptoms:  suicidal thoughts with specific plan, in context of wife finding out about infidelity (Hypo) Manic Symptoms:  Denies  Anxiety Symptoms:  Denies any significant symptoms, some work related stress, better controlled after therapy   Psychotic Symptoms:   Denies  PTSD Symptoms:  Denies  Total Time spent with patient: 1 hour   Past Psychiatric Hx: Previous Psych Diagnoses: None Prior inpatient treatment: None  Current/prior outpatient treatment: Patient denies, EMR shows Lexapro  10 mg daily  Prior rehab hx: Denies  Psychotherapy hx: Yes, remotely, within the last year  History of suicide: Denies History of homicide or aggression: Denies   Psychiatric medication history: EMR shows Lexapro  10 mg daily  Psychiatric medication compliance history: None  Neuromodulation history: Denies Current Psychiatrist: Denies Current therapist: Yes, remotely, within the last year to manage work related stress    Substance Abuse Hx: Alcohol: Occasional  Tobacco: Cigars x2-3 monthly Illicit drugs: Denies  Rx drug abuse: Denies  Rehab hx: Denies    Past Medical History: Medical Diagnoses: Migraine  Home Rx: Sumatriptan  and Topiramate  Prior Hosp: None reported  Prior Surgeries/Trauma: Tonsillectomy, adenoidectomy at 60/28 years old  Head trauma, LOC, concussions, seizures:  Denies  Migraine: x2-3 weekly Allergies: NKDA LMP: N/A  PCP: Deferred    Family History: Medical: None  reported  Psych: Unaware  Psych Rx: Unaware  SA/HA: Maternal Uncle SA  Substance use family hx: Crack    Social History: Childhood (bring, raised, lives now, parents, siblings, schooling, education): Patient grew up in [place]   with mom, dad and younger brother.  He graduated from [employer] and attended [employer]. Abuse: Denies  Marital Status: Married  Sexual orientation: Heterosexual  Children: Did not ask  Employment: Warehouse manager Group: support with mom, brother and several friends  Housing: Home, mom plans to stay with him for some time  Legal: No ongoing Paediatric nurse: Denies  Firearms: No firearms in personal residence     Continued Clinical Symptoms:  Alcohol Use Disorder Identification Test Final Score (AUDIT): 3 The Alcohol Use Disorders Identification Test, Guidelines for Use in Primary Care, Second Edition.  World Science writer [facility]). Score between 0-7:  no or low risk or alcohol related problems. Score between 8-15:  moderate risk of alcohol related problems. Score between 16-19:  high risk of alcohol related problems. Score 20 or above:  warrants further diagnostic evaluation for alcohol dependence and treatment.   CLINICAL FACTORS:  None  Musculoskeletal: Strength & Muscle Tone: within normal limits Gait & Station: normal Patient leans: N/A  Psychiatric Specialty Exam:  Presentation  General Appearance:  Appropriate for Environment; Casual  Eye Contact: Good  Speech: Clear and Coherent; Normal Rate  Speech Volume: Normal  Handedness: Right   Mood and Affect  Mood: Euthymic  Affect: Appropriate; Congruent   Thought Process  Thought Processes: Coherent; Goal Directed; Linear  Descriptions of Associations:Intact  Orientation:Full (Time, Place and Person)  Thought Content:Logical  History of Schizophrenia/Schizoaffective disorder:No  Duration of Psychotic  Symptoms:No data recorded Hallucinations:Hallucinations: None  Ideas of Reference:None  Suicidal Thoughts:Suicidal Thoughts: No SI Active Intent and/or Plan: Without Means to Carry Out; Without Intent; Without Plan; Without Access to Means  Homicidal Thoughts:Homicidal Thoughts: No   Sensorium  Memory: Immediate Good; Recent Good  Judgment: Fair  Insight: Good   Executive Functions  Concentration: Good  Attention Span: Good  Recall: Good  Fund of Knowledge: Good  Language: Good   Psychomotor Activity  Psychomotor Activity: Psychomotor Activity: Normal   Assets  Assets: Communication Skills; Desire for Improvement; Housing; Health and safety inspector; Resilience; Social Support; English as a second language teacher; Vocational/Educational; Physical Health   Sleep  Sleep: Sleep: Fair Number of Hours of Sleep: 6    Physical Exam: Physical Exam Constitutional:      Appearance: Normal appearance.  Eyes:     Conjunctiva/sclera: Conjunctivae normal.  Pulmonary:     Effort: Pulmonary effort is normal.  Musculoskeletal:        General: Normal range of motion.  Neurological:     Mental Status: He is oriented to person, place, and time.     Review of Systems  Constitutional:  Negative for chills, diaphoresis and fever.  Respiratory:  Negative for cough.   Gastrointestinal:  Negative for nausea and vomiting.  Neurological:  Negative for headaches.  Psychiatric/Behavioral:  Negative for depression, hallucinations, substance abuse and suicidal ideas. The patient is nervous/anxious. The patient does not have insomnia.   Blood pressure (!) 140/84, pulse 83, temperature 98.3 F (36.8 C), temperature source Oral, resp. rate 16, height 5' 9 (1.753 m), weight 122.5 kg, SpO2 100%. Body mass index is 39.87 kg/m.   COGNITIVE FEATURES THAT CONTRIBUTE TO RISK:  None    SUICIDE RISK:  Mild:  There are no identifiable suicide plans, no associated intent, mild dysphoria and  related symptoms, good self-control (both objective and subjective assessment), few other risk factors, and identifiable protective factors, including available and accessible social support. Patient has good support network at home and will be monitored closely by parents and several family members. There are no firearms, weapons or large stockpiles of pills within residence of return    PLAN OF CARE:   Broughton Eppinger is a 28 y.o. male with no significant psychiatric history who presented to the behavioral health urgent care center on 8/17 for suicidal ideations with a plan to use a gun. He was transferred to the [facility] symptomatic stabilization and further psychiatric evaluation.   On intake assessment, the patient is linear and goal directed towards motivations.  He denies any mood symptoms suggestive of a depressive, bipolar or psychotic disorder.  Patient denies suicidal ideations or homicidal ideations this morning.  Patient reports that incident over the weekend was an impulsive decision and being overwhelmed by his wife finding about his infidelity.  Patient reports that he is now ready to face the consequences of his decision losing my wife and that this 1 moment will not define him.  He reports some regret over actions towards wife. Patient seems to have good insight over the situation, wants to restart therapy and also has a good support network in place. He reports that he has good support with mom, brother and friends who plans to come and stay with him over the next several weeks.  Confirmed with mom who denies any safety concerns with the son being discharged today, she suspects that his suicidal gesture was, a cry out for help to his wife.  She was made aware of the plan for discharge and denies any acute concerns regarding his mood. We will defer starting medications at this time per patient request.  Patient also has upcoming mandatory obligation with work  tomorrow, if he were to remain inpatient we would also risk the alternative of worsening symptom stabilization of losing his job. He will be discharged on 8/18 to mom with therapy services arranged.    PLAN: Safety and Monitoring:             --  Voluntary admission to inpatient psychiatric unit for safety, stabilization and treatment             -- Daily contact with patient to assess and evaluate symptoms and progress in treatment             -- Patient's case to be discussed in multi-disciplinary team meeting             -- Observation Level : q15 minute checks             -- Vital signs:  q12 hours             -- Precautions: suicide, elopement, and assault   2. Psychiatric Diagnoses and Treatment:              -- Continue Hydroxyzine  25 mg three times daily as need for anxiety              -- Continue Trazodone  50 mg at bedtime as needed for insomnia  -- The risks/benefits/side-effects/alternatives to this medication were discussed in detail with the patient and time was given for questions. The patient consents to medication trial.  -- FDA             -- Metabolic profile and EKG monitoring obtained while on an atypical antipsychotic (BMI: Lipid Panel: HbgA1c: QTc:)              -- Encouraged patient to participate in unit milieu and in scheduled group therapies              -- Short Term Goals: Ability to verbalize feelings will improve, Ability to demonstrate self-control will improve, and Ability to identify and develop effective coping behaviors will improve             -- Long Term Goals: Improvement in symptoms so as ready for discharge                3. Medical Issues Being Addressed:              None   4. Discharge Planning:              -- Social work and case management to assist with discharge planning and identification of hospital follow-up needs prior to discharge             -- Estimated LOS: 1-2 days, discharge this evening 8/18              --  Discharge  Concerns:Minimal, patient has good support network and therapy services arranged to work             -- Discharge Goals: Return home with outpatient referrals for mental health follow-up including medication management/psychotherapy  I certify that inpatient services furnished can reasonably be expected to improve the patient's condition.   Keil Pickering, MD 03/09/2024, 1:39 PM

## 2024-03-09 NOTE — Progress Notes (Signed)
 Adult Psychoeducational Group Note  Date:  03/09/2024 Time:  9:14 AM  Group Topic/Focus:  Goals Group:   The focus of this group is to help patients establish daily goals to achieve during treatment and discuss how the patient can incorporate goal setting into their daily lives to aide in recovery.  Participation Level:  Active  Participation Quality:  Appropriate  Affect:  Appropriate  Cognitive:  Appropriate  Insight: Appropriate  Engagement in Group:  Engaged  Modes of Intervention:  Discussion  Additional Comments:  Pt goal for the day is to talk with doctor and develop a discharge plan.  Daine Pillar D 03/09/2024, 9:14 AM

## 2024-03-09 NOTE — Progress Notes (Signed)
  [facility] Adult Case Management Discharge Plan :  Will you be returning to the same living situation after discharge:  Yes,  pt will be returning home after discharge.  At discharge, do you have transportation home?: Yes,  pt will be transported home via his mother, Findley Vi at 4:00PM. Do you have the ability to pay for your medications: Yes,  pt is employed and insured - [employer].  Release of information consent forms completed and in the chart;  Patient's signature needed at discharge.  Patient to Follow up at:  Follow-up Information     [facility] Follow up on 03/17/2024.   Why: You have an appt for outpatient therapy on 03/17/2024 at 10:00 am, this is an in person appt. Contact information: [address],  [place], [place] [address] Phone: [phone] FAX: [phone]        [facility] Follow up on 03/13/2024.   Why: Please call this provider on 03/13/24 at 9:00 am to schedule an appointment for medication management services. Contact information: [address]  [address] [place] [address] [phone]                 Next level of care provider has access to [facility] Link:no  Safety Planning and Suicide Prevention discussed: Yes,  completed with Jedi Catalfamo (mother) [phone]     Has patient been referred to the Quitline?: Patient refused referral for treatment  Patient has been referred for addiction treatment: Patient refused referral for treatment.  Kasi Lasky M Markita Stcharles, LCSWA 03/09/2024, 1:44 PM

## 2024-03-10 NOTE — Progress Notes (Signed)
   03/09/24 1430  Spiritual Encounters  Type of Visit Initial  Care provided to: Patient  Referral source Patient request  Reason for visit Routine spiritual support  OnCall Visit No   Following spirituality group, I met with Fonda one-on-one to offersupport at his request.  Brittan processed emotion and shared concerns around an area of crisis in his personal relationships. He processed feelings relating to guilt and shame, while also expressing accountability and hope. He demonstrated openness, honesty, care, and remorse.  I provided compassionate, non-anxious presence and emotional support. I provided active and reflective listening. I named elements for celebration amid Smaran's despair: celebration for his life and that he had survived SI, the presence of loving and supportive family that came to his aid, and also gratitude for the positive aspects of his character on display. From this place of gratitude I invited Johsua to reflect on ways that some errors of judgment do not define him. I facilitated refelction and meaning making. I offered prayer at Laval's request and words of encouragement.  Iness Pangilinan L. Fredrica, M.Div [phone]
